# Patient Record
Sex: Female | Born: 1995 | Marital: Single | Smoking: Never smoker
Health system: Southern US, Community
[De-identification: ages and names within clinical notes are randomized; demographics above are authoritative.]

## PROBLEM LIST (undated history)

## (undated) ENCOUNTER — Inpatient Hospital Stay (HOSPITAL_COMMUNITY): Payer: Self-pay

## (undated) DIAGNOSIS — J45909 Unspecified asthma, uncomplicated: Secondary | ICD-10-CM

## (undated) HISTORY — PX: OTHER SURGICAL HISTORY: SHX169

## (undated) HISTORY — DX: Unspecified asthma, uncomplicated: J45.909

---

## 2014-09-22 ENCOUNTER — Ambulatory Visit (INDEPENDENT_AMBULATORY_CARE_PROVIDER_SITE_OTHER): Payer: BC Managed Care – PPO | Admitting: Obstetrics and Gynecology

## 2014-09-22 ENCOUNTER — Encounter: Payer: Self-pay | Admitting: Obstetrics and Gynecology

## 2014-09-22 VITALS — BP 104/60 | HR 64 | Resp 14 | Ht 67.0 in | Wt 118.0 lb

## 2014-09-22 DIAGNOSIS — Z113 Encounter for screening for infections with a predominantly sexual mode of transmission: Secondary | ICD-10-CM | POA: Diagnosis not present

## 2014-09-22 DIAGNOSIS — Z01419 Encounter for gynecological examination (general) (routine) without abnormal findings: Secondary | ICD-10-CM

## 2014-09-22 DIAGNOSIS — A499 Bacterial infection, unspecified: Secondary | ICD-10-CM

## 2014-09-22 DIAGNOSIS — Z0000 Encounter for general adult medical examination without abnormal findings: Secondary | ICD-10-CM

## 2014-09-22 DIAGNOSIS — N760 Acute vaginitis: Secondary | ICD-10-CM

## 2014-09-22 DIAGNOSIS — Z3009 Encounter for other general counseling and advice on contraception: Secondary | ICD-10-CM

## 2014-09-22 DIAGNOSIS — B9689 Other specified bacterial agents as the cause of diseases classified elsewhere: Secondary | ICD-10-CM

## 2014-09-22 DIAGNOSIS — Z8619 Personal history of other infectious and parasitic diseases: Secondary | ICD-10-CM | POA: Diagnosis not present

## 2014-09-22 LAB — POCT URINALYSIS DIPSTICK
BILIRUBIN UA: NEGATIVE
GLUCOSE UA: NEGATIVE
KETONES UA: NEGATIVE
LEUKOCYTES UA: NEGATIVE
Nitrite, UA: NEGATIVE
PH UA: 6.5
Protein, UA: NEGATIVE
RBC UA: NEGATIVE
Urobilinogen, UA: NEGATIVE

## 2014-09-22 LAB — POCT URINE PREGNANCY: Preg Test, Ur: NEGATIVE

## 2014-09-22 MED ORDER — MEDROXYPROGESTERONE ACETATE 150 MG/ML IM SUSP: 150.0000 mg | INTRAMUSCULAR | Status: DC

## 2014-09-22 MED ORDER — METRONIDAZOLE 500 MG PO TABS: 500.0000 mg | ORAL_TABLET | Freq: Two times a day (BID) | ORAL | Status: DC

## 2014-09-22 MED ORDER — MEDROXYPROGESTERONE ACETATE 150 MG/ML IM SUSP
150.0000 mg | Freq: Once | INTRAMUSCULAR | Status: AC
  Administered 2014-09-22: 150 mg via INTRAMUSCULAR

## 2014-09-22 NOTE — Patient Instructions (Signed)
Bacterial Vaginosis Bacterial vaginosis is a vaginal infection that occurs when the normal balance of bacteria in the vagina is disrupted. It results from an overgrowth of certain bacteria. This is the most common vaginal infection in women of childbearing age. Treatment is important to prevent complications, especially in pregnant women, as it can cause a premature delivery. CAUSES  Bacterial vaginosis is caused by an increase in harmful bacteria that are normally present in smaller amounts in the vagina. Several different kinds of bacteria can cause bacterial vaginosis. However, the reason that the condition develops is not fully understood. RISK FACTORS Certain activities or behaviors can put you at an increased risk of developing bacterial vaginosis, including:  Having a new sex partner or multiple sex partners.  Douching.  Using an intrauterine device (IUD) for contraception. Women do not get bacterial vaginosis from toilet seats, bedding, swimming pools, or contact with objects around them. SIGNS AND SYMPTOMS  Some women with bacterial vaginosis have no signs or symptoms. Common symptoms include:  Grey vaginal discharge.  A fishlike odor with discharge, especially after sexual intercourse.  Itching or burning of the vagina and vulva.  Burning or pain with urination. DIAGNOSIS  Your health care provider will take a medical history and examine the vagina for signs of bacterial vaginosis. A sample of vaginal fluid may be taken. Your health care provider will look at this sample under a microscope to check for bacteria and abnormal cells. A vaginal pH test may also be done.  TREATMENT  Bacterial vaginosis may be treated with antibiotic medicines. These may be given in the form of a pill or a vaginal cream. A second round of antibiotics may be prescribed if the condition comes back after treatment.  HOME CARE INSTRUCTIONS   Only take over-the-counter or prescription medicines as  directed by your health care provider.  If antibiotic medicine was prescribed, take it as directed. Make sure you finish it even if you start to feel better.  Do not have sex until treatment is completed.  Tell all sexual partners that you have a vaginal infection. They should see their health care provider and be treated if they have problems, such as a mild rash or itching.  Practice safe sex by using condoms and only having one sex partner. SEEK MEDICAL CARE IF:   Your symptoms are not improving after 3 days of treatment.  You have increased discharge or pain.  You have a fever. MAKE SURE YOU:   Understand these instructions.  Will watch your condition.  Will get help right away if you are not doing well or get worse. FOR MORE INFORMATION  Centers for Disease Control and Prevention, Division of STD Prevention: SolutionApps.co.za American Sexual Health Association (ASHA): www.ashastd.org  Document Released: 01/07/2005 Document Revised: 10/28/2012 Document Reviewed: 08/19/2012 [facility] Patient Information 2015 [place], [place]. This information is not intended to replace advice given to you by your health care provider. Make sure you discuss any questions you have with your health care provider.  EXERCISE AND DIET:  We recommended that you start or continue a regular exercise program for good health. Regular exercise means any activity that makes your heart beat faster and makes you sweat.  We recommend exercising at least 30 minutes per day at least 3 days a week, preferably 4 or 5.  We also recommend a diet low in fat and sugar.  Inactivity, poor dietary choices and obesity can cause diabetes, heart attack, stroke, and kidney damage, among others.  ALCOHOL AND SMOKING:  Women should limit their alcohol intake to no more than 7 drinks/beers/glasses of wine (combined, not each!) per week. Moderation of alcohol intake to this level decreases your risk of breast cancer and liver damage.  And of course, no recreational drugs are part of a healthy lifestyle.  And absolutely no smoking or even second hand smoke. Most people know smoking can cause heart and lung diseases, but did you know it also contributes to weakening of your bones? Aging of your skin?  Yellowing of your teeth and nails?  CALCIUM AND VITAMIN D:  Adequate intake of calcium and Vitamin D are recommended.  The recommendations for exact amounts of these supplements seem to change often, but generally speaking 600 mg of calcium (either carbonate or citrate) and 800 units of Vitamin D per day seems prudent. Certain women may benefit from higher intake of Vitamin D.  If you are among these women, your doctor will have told you during your visit.    PAP SMEARS:  Pap smears, to check for cervical cancer or precancers,  have traditionally been done yearly, although recent scientific advances have shown that most women can have pap smears less often.  However, every woman still should have a physical exam from her gynecologist every year. It will include a breast check, inspection of the vulva and vagina to check for abnormal growths or skin changes, a visual exam of the cervix, and then an exam to evaluate the size and shape of the uterus and ovaries.  And after 20 years of age, a rectal exam is indicated to check for rectal cancers. We will also provide age appropriate advice regarding health maintenance, like when you should have certain vaccines, screening for sexually transmitted diseases, bone density testing, colonoscopy, mammograms, etc.   MAMMOGRAMS:  All women over 9 years old should have a yearly mammogram. Many facilities now offer a "3D" mammogram, which may cost around $50 extra out of pocket. If possible,  we recommend you accept the option to have the 3D mammogram performed.  It both reduces the number of women who will be called back for extra views which then turn out to be normal, and it is better than the routine  mammogram at detecting truly abnormal areas.    COLONOSCOPY:  Colonoscopy to screen for colon cancer is recommended for all women at age 10.  We know, you hate the idea of the prep.  We agree, BUT, having colon cancer and not knowing it is worse!!  Colon cancer so often starts as a polyp that can be seen and removed at colonscopy, which can quite literally save your life!  And if your first colonoscopy is normal and you have no family history of colon cancer, most women don't have to have it again for 10 years.  Once every ten years, you can do something that may end up saving your life, right?  We will be happy to help you get it scheduled when you are ready.  Be sure to check your insurance coverage so you understand how much it will cost.  It may be covered as a preventative service at no cost, but you should check your particular policy.
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
## 2014-09-22 NOTE — Addendum Note (Signed)
Addended by: Dion Body on: 09/22/2014 02:51 PM   Modules accepted: Orders

## 2014-09-23 LAB — STD PANEL
HIV: NONREACTIVE
Hepatitis B Surface Ag: NEGATIVE

## 2014-09-23 LAB — GC/CHLAMYDIA PROBE AMP
CT Probe RNA: NEGATIVE
GC Probe RNA: NEGATIVE

## 2014-09-23 LAB — HEPATITIS C ANTIBODY: HCV AB: NEGATIVE

## 2014-09-27 ENCOUNTER — Telehealth: Payer: Self-pay | Admitting: *Deleted

## 2014-09-27 NOTE — Telephone Encounter (Signed)
-----   Message from Romualdo Bolk, MD sent at 09/23/2014  1:28 PM EDT ----- Please advise the patient of normal results.

## 2014-09-27 NOTE — Telephone Encounter (Signed)
LMTC to call in regards to lab results -eh 

## 2014-10-03 NOTE — Telephone Encounter (Signed)
Patient is aware of her test results -eh

## 2015-02-15 ENCOUNTER — Ambulatory Visit (INDEPENDENT_AMBULATORY_CARE_PROVIDER_SITE_OTHER): Payer: BC Managed Care – PPO

## 2015-02-15 VITALS — BP 98/62 | HR 74 | Resp 16 | Ht 67.0 in | Wt 120.0 lb

## 2015-02-15 DIAGNOSIS — Z3009 Encounter for other general counseling and advice on contraception: Secondary | ICD-10-CM

## 2015-02-15 LAB — POCT URINE PREGNANCY: PREG TEST UR: NEGATIVE

## 2015-02-15 NOTE — Progress Notes (Signed)
Patient is here for pregnancy test to restart depo. Patient advised to abstain from sex for 2 weeks and follow up with another pregnancy test before getting the depo provera injection.  Patient understands instructions and will schedule appt.  She has no other complaints or concerns.  Last aex and depo provera injection:09/22/2014 Next aex:Not scheduled  No other complaints or concerns.  Encounter routed to provider.

## 2015-03-01 ENCOUNTER — Ambulatory Visit: Payer: BC Managed Care – PPO

## 2015-03-01 ENCOUNTER — Telehealth: Payer: Self-pay | Admitting: Obstetrics and Gynecology

## 2015-03-01 NOTE — Telephone Encounter (Signed)
Patient did not keep her appointment for Depo today. I called the patient but could not leave a message. Not Available - Phone message said, "This number is not set up to take incoming calls."

## 2015-03-08 NOTE — Telephone Encounter (Signed)
Called patient in attempt to reschedule her missed appointment but got the message: "This number is not set up to take incoming calls." Okay to close encounter or is further follow up needed?

## 2015-03-08 NOTE — Telephone Encounter (Signed)
It's the patient's choice to get depo-provera. Will close the encounter.

## 2015-05-01 ENCOUNTER — Emergency Department (HOSPITAL_COMMUNITY)
Admission: EM | Admit: 2015-05-01 | Discharge: 2015-05-01 | Disposition: A | Discharge status: Home / Self Care | Payer: No Typology Code available for payment source | Attending: Emergency Medicine | Admitting: Emergency Medicine

## 2015-05-01 ENCOUNTER — Encounter (HOSPITAL_COMMUNITY): Payer: Self-pay | Admitting: Emergency Medicine

## 2015-05-01 DIAGNOSIS — Y998 Other external cause status: Secondary | ICD-10-CM | POA: Insufficient documentation

## 2015-05-01 DIAGNOSIS — Y9389 Activity, other specified: Secondary | ICD-10-CM | POA: Diagnosis not present

## 2015-05-01 DIAGNOSIS — J45909 Unspecified asthma, uncomplicated: Secondary | ICD-10-CM | POA: Insufficient documentation

## 2015-05-01 DIAGNOSIS — Y92410 Unspecified street and highway as the place of occurrence of the external cause: Secondary | ICD-10-CM | POA: Diagnosis not present

## 2015-05-01 DIAGNOSIS — S199XXA Unspecified injury of neck, initial encounter: Secondary | ICD-10-CM | POA: Insufficient documentation

## 2015-05-01 NOTE — ED Notes (Signed)
Called  No response from lobby 

## 2015-05-01 NOTE — ED Notes (Signed)
No answer when pt's name called in the waiting room 
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
## 2015-05-04 ENCOUNTER — Emergency Department (HOSPITAL_COMMUNITY)
Admission: EM | Admit: 2015-05-04 | Discharge: 2015-05-04 | Disposition: A | Discharge status: Home / Self Care | Payer: No Typology Code available for payment source | Attending: Emergency Medicine | Admitting: Emergency Medicine

## 2015-05-04 ENCOUNTER — Encounter (HOSPITAL_COMMUNITY): Payer: Self-pay | Admitting: Emergency Medicine

## 2015-05-04 DIAGNOSIS — S4991XA Unspecified injury of right shoulder and upper arm, initial encounter: Secondary | ICD-10-CM | POA: Insufficient documentation

## 2015-05-04 DIAGNOSIS — Z79899 Other long term (current) drug therapy: Secondary | ICD-10-CM | POA: Insufficient documentation

## 2015-05-04 DIAGNOSIS — S29002A Unspecified injury of muscle and tendon of back wall of thorax, initial encounter: Secondary | ICD-10-CM | POA: Insufficient documentation

## 2015-05-04 DIAGNOSIS — J45909 Unspecified asthma, uncomplicated: Secondary | ICD-10-CM | POA: Insufficient documentation

## 2015-05-04 DIAGNOSIS — M549 Dorsalgia, unspecified: Secondary | ICD-10-CM

## 2015-05-04 DIAGNOSIS — Y998 Other external cause status: Secondary | ICD-10-CM | POA: Insufficient documentation

## 2015-05-04 DIAGNOSIS — Y92410 Unspecified street and highway as the place of occurrence of the external cause: Secondary | ICD-10-CM | POA: Diagnosis not present

## 2015-05-04 DIAGNOSIS — S4992XA Unspecified injury of left shoulder and upper arm, initial encounter: Secondary | ICD-10-CM | POA: Insufficient documentation

## 2015-05-04 DIAGNOSIS — Y9389 Activity, other specified: Secondary | ICD-10-CM | POA: Insufficient documentation

## 2015-05-04 MED ORDER — NAPROXEN 500 MG PO TABS
500.0000 mg | ORAL_TABLET | Freq: Once | ORAL | Status: AC
  Administered 2015-05-04: 500 mg via ORAL
  Filled 2015-05-04: qty 1

## 2015-05-04 MED ORDER — NAPROXEN 500 MG PO TABS: 500.0000 mg | ORAL_TABLET | Freq: Two times a day (BID) | ORAL | Status: DC

## 2015-05-04 MED ORDER — METHOCARBAMOL 500 MG PO TABS
500.0000 mg | ORAL_TABLET | Freq: Once | ORAL | Status: AC
  Administered 2015-05-04: 500 mg via ORAL
  Filled 2015-05-04: qty 1

## 2015-05-04 MED ORDER — METHOCARBAMOL 500 MG PO TABS: 500.0000 mg | ORAL_TABLET | Freq: Two times a day (BID) | ORAL | Status: DC
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
## 2015-06-22 ENCOUNTER — Encounter: Payer: Self-pay | Admitting: Obstetrics and Gynecology

## 2015-06-22 ENCOUNTER — Telehealth: Payer: Self-pay | Admitting: Obstetrics and Gynecology

## 2015-06-22 NOTE — Telephone Encounter (Signed)
Patient would like to speak with the nurse regarding restarting the depo.

## 2015-06-23 NOTE — Telephone Encounter (Signed)
Spoke with patient. Patient would like to restart depo injections at this time. Patient was on depo five years ago. Discussed restarting depo with Dr.Jertson at her appointment on 09/22/2014. Advised she will need to come in for UPT if this is negative will need to abstain from intercourse for 2 weeks then return for another UPT and depo injection if both UPTs are negative. She is agreeable. Nurse visit for UPT scheduled for 06/27/2015 at 8:30 am. Patient declines to schedule 2 week upt and depo at this time. She will schedule this at her appointment on 06/27/2015.  Routing to provider for final review. Patient agreeable to disposition. Will close encounter.

## 2015-06-27 ENCOUNTER — Ambulatory Visit (INDEPENDENT_AMBULATORY_CARE_PROVIDER_SITE_OTHER): Payer: BC Managed Care – PPO

## 2015-06-27 VITALS — BP 102/60 | HR 72 | Resp 16 | Wt 114.0 lb

## 2015-06-27 DIAGNOSIS — Z3049 Encounter for surveillance of other contraceptives: Secondary | ICD-10-CM

## 2015-06-27 LAB — POCT URINE PREGNANCY: Preg Test, Ur: NEGATIVE

## 2015-06-27 NOTE — Progress Notes (Signed)
Patient here for UPT before receiving depo provera injection. Urine pregnancy test was negative; patient was instructed to abstain and return for another UPT in two weeks before getting depo shot.

## 2015-07-13 ENCOUNTER — Ambulatory Visit: Payer: BC Managed Care – PPO

## 2015-07-20 ENCOUNTER — Telehealth: Payer: Self-pay | Admitting: Obstetrics and Gynecology

## 2015-07-20 ENCOUNTER — Encounter: Payer: Self-pay | Admitting: Obstetrics and Gynecology

## 2015-07-20 ENCOUNTER — Ambulatory Visit: Payer: BC Managed Care – PPO

## 2015-07-20 NOTE — Telephone Encounter (Signed)
Patient dnka her depo appointment 07/20/15. I was unable to leave a message, letter mailed.

## 2015-07-21 NOTE — Telephone Encounter (Signed)
This is Dr. Edward JollySilva reviewing Dr. Salli QuarryJertson's in box.  Thank you for the update.  I have closed the encounter.

## 2015-10-20 ENCOUNTER — Ambulatory Visit: Payer: BC Managed Care – PPO | Admitting: Certified Nurse Midwife

## 2015-10-20 ENCOUNTER — Telehealth: Payer: Self-pay | Admitting: Certified Nurse Midwife

## 2015-10-20 NOTE — Telephone Encounter (Signed)
Patient cancelled appointment today. See staff message °

## 2015-10-26 ENCOUNTER — Encounter: Payer: Self-pay | Admitting: Obstetrics and Gynecology

## 2015-11-12 ENCOUNTER — Emergency Department (HOSPITAL_COMMUNITY): Payer: Medicaid Other

## 2015-11-12 ENCOUNTER — Emergency Department (HOSPITAL_COMMUNITY)
Admission: EM | Admit: 2015-11-12 | Discharge: 2015-11-12 | Disposition: A | Discharge status: Home / Self Care | Payer: Medicaid Other | Attending: Emergency Medicine | Admitting: Emergency Medicine

## 2015-11-12 ENCOUNTER — Encounter (HOSPITAL_COMMUNITY): Payer: Self-pay | Admitting: Nurse Practitioner

## 2015-11-12 DIAGNOSIS — O200 Threatened abortion: Secondary | ICD-10-CM | POA: Diagnosis not present

## 2015-11-12 DIAGNOSIS — J45909 Unspecified asthma, uncomplicated: Secondary | ICD-10-CM | POA: Insufficient documentation

## 2015-11-12 DIAGNOSIS — O418X20 Other specified disorders of amniotic fluid and membranes, second trimester, not applicable or unspecified: Secondary | ICD-10-CM

## 2015-11-12 DIAGNOSIS — O4690 Antepartum hemorrhage, unspecified, unspecified trimester: Secondary | ICD-10-CM

## 2015-11-12 DIAGNOSIS — Z3A12 12 weeks gestation of pregnancy: Secondary | ICD-10-CM | POA: Insufficient documentation

## 2015-11-12 DIAGNOSIS — O468X2 Other antepartum hemorrhage, second trimester: Secondary | ICD-10-CM

## 2015-11-12 DIAGNOSIS — Z79899 Other long term (current) drug therapy: Secondary | ICD-10-CM | POA: Insufficient documentation

## 2015-11-12 DIAGNOSIS — O208 Other hemorrhage in early pregnancy: Secondary | ICD-10-CM | POA: Diagnosis present

## 2015-11-12 LAB — CBC WITH DIFFERENTIAL/PLATELET
Basophils Absolute: 0 10*3/uL (ref 0.0–0.1)
Basophils Relative: 0 %
EOS ABS: 0.2 10*3/uL (ref 0.0–0.7)
EOS PCT: 2 %
HCT: 31.3 % — ABNORMAL LOW (ref 36.0–46.0)
HEMOGLOBIN: 11.2 g/dL — AB (ref 12.0–15.0)
LYMPHS ABS: 2.9 10*3/uL (ref 0.7–4.0)
Lymphocytes Relative: 31 %
MCH: 31.4 pg (ref 26.0–34.0)
MCHC: 35.8 g/dL (ref 30.0–36.0)
MCV: 87.7 fL (ref 78.0–100.0)
MONO ABS: 1.3 10*3/uL — AB (ref 0.1–1.0)
MONOS PCT: 14 %
Neutro Abs: 5.1 10*3/uL (ref 1.7–7.7)
Neutrophils Relative %: 53 %
PLATELETS: 256 10*3/uL (ref 150–400)
RBC: 3.57 MIL/uL — ABNORMAL LOW (ref 3.87–5.11)
RDW: 12.5 % (ref 11.5–15.5)
WBC: 9.5 10*3/uL (ref 4.0–10.5)

## 2015-11-12 LAB — HIV ANTIBODY (ROUTINE TESTING W REFLEX): HIV Screen 4th Generation wRfx: NONREACTIVE

## 2015-11-12 LAB — BASIC METABOLIC PANEL
Anion gap: 5 (ref 5–15)
BUN: 11 mg/dL (ref 6–20)
CHLORIDE: 106 mmol/L (ref 101–111)
CO2: 24 mmol/L (ref 22–32)
CREATININE: 0.41 mg/dL — AB (ref 0.44–1.00)
Calcium: 8.9 mg/dL (ref 8.9–10.3)
GFR calc Af Amer: >60 mL/min (ref >60)
GFR calc non Af Amer: >60 mL/min (ref >60)
Glucose, Bld: 84 mg/dL (ref 65–99)
Potassium: 3.9 mmol/L (ref 3.5–5.1)
SODIUM: 135 mmol/L (ref 135–145)

## 2015-11-12 LAB — RPR: RPR: NONREACTIVE

## 2015-11-12 LAB — I-STAT BETA HCG BLOOD, ED (MC, WL, AP ONLY)

## 2015-11-12 LAB — HCG, QUANTITATIVE, PREGNANCY: hCG, Beta Chain, Quant, S: 188794 m[IU]/mL — ABNORMAL HIGH (ref <5)

## 2015-11-12 LAB — ABO/RH: ABO/RH(D): O POS

## 2015-11-12 NOTE — ED Triage Notes (Signed)
Pt states she has been having intermittent vaginal bleeding since 3 pm, also c/o lower abd pain.

## 2015-11-12 NOTE — ED Provider Notes (Signed)
[facility]-EMERGENCY DEPT Provider Note   CSN: [id number] Arrival date & time: 11/12/15  0222  By signing my name below, I, Kristina Reyes, attest that this documentation has been prepared under the direction and in the presence of Elpidio Anis, PA-C. Electronically Signed: Alyssa Reyes, ED Scribe. 11/12/15. 3:21 AM.  History   Chief Complaint Chief Complaint  Patient presents with  . Vaginal Bleeding  . [redacted] Weeks Pregnant   The history is provided by the patient. No language interpreter was used.   HPI Comments: Kristina Reyes is a 20 y.o. female who presents to the Emergency Department complaining of gradual onset, episodic vaginal bleeding onset 3 PM today. Pt is currently [redacted] weeks pregnant. This is the pt's first pregnancy. Compared to her period, she states her bleeding is very light. Pt is not passing clots. She reports associated mild lower abdominal pain. Pt is recovering from a recent cold and states she still has a fever. She has been taking Tylenol for symptom relief. Denies back pain, dysuria, hematuria, urinary frequency.  Past Medical History:  Diagnosis Date  . Asthma     There are no active problems to display for this patient.   Past Surgical History:  Procedure Laterality Date  . arm surgery Left    BB removed from left forearm.     OB History    Gravida Para Term Preterm AB Living   0 0 0 0 0 0   SAB TAB Ectopic Multiple Live Births   0 0 0 0         Home Medications    Prior to Admission medications   Medication Sig Start Date End Date Taking? Authorizing Provider  medroxyPROGESTERone (DEPO-PROVERA) 150 MG/ML injection Inject 150 mg into the muscle every 3 (three) months.    Historical Provider, MD  medroxyPROGESTERone (DEPO-PROVERA) 150 MG/ML injection Inject 1 mL (150 mg total) into the muscle every 3 (three) months. 09/22/14   Romualdo Bolk, MD  methocarbamol (ROBAXIN) 500 MG tablet Take 1 tablet (500 mg total) by mouth 2 (two) times daily. 05/04/15    Mady Gemma, PA-C  metroNIDAZOLE (FLAGYL) 500 MG tablet Take 1 tablet (500 mg total) by mouth 2 (two) times daily. 09/22/14   Romualdo Bolk, MD  naproxen (NAPROSYN) 500 MG tablet Take 1 tablet (500 mg total) by mouth 2 (two) times daily with a meal. 05/04/15   Mady Gemma, PA-C    Family History Family History  Problem Relation Age of Onset  . Asthma Mother   . Asthma Brother     Social History Social History  Substance Use Topics  . Smoking status: Never Smoker  . Smokeless tobacco: Never Used  . Alcohol use No     Allergies   Review of patient's allergies indicates no known allergies.   Review of Systems Review of Systems  Constitutional: Positive for fever.  Gastrointestinal: Positive for abdominal pain.  Genitourinary: Positive for vaginal bleeding. Negative for difficulty urinating, dysuria and frequency.  Musculoskeletal: Negative for back pain.  All other systems reviewed and are negative.    Physical Exam Updated Vital Signs BP 104/66 (BP Location: Left Arm)   Pulse 82   Temp 98.4 F (36.9 C) (Oral)   Resp 16   SpO2 99%   Physical Exam  Constitutional: She is oriented to person, place, and time. She appears well-developed and well-nourished.  Neck: Normal range of motion.  Pulmonary/Chest: Effort normal.  Abdominal: Soft. There is no tenderness.  Genitourinary:  Genitourinary Comments: Cervical bleeding present without clots or visualized POC. Mild pelvic tenderness throughout.   Neurological: She is alert and oriented to person, place, and time.  Skin: Skin is warm and dry.     ED Treatments / Results  DIAGNOSTIC STUDIES: Oxygen Saturation is 99% on RA, normal by my interpretation.    COORDINATION OF CARE: 3:15 AM Discussed treatment plan with pt at bedside which includes Pelvic exam and lab work and pt agreed to plan.  Labs (all labs ordered are listed, but only abnormal results are displayed) Labs Reviewed - No data to  display  EKG  EKG Interpretation None       Radiology No results found. [name] Ob Comp Less 14 Wks  Result Date: 11/12/2015 CLINICAL DATA:  20 year old female with positive HCG levels presenting with vaginal bleeding. EXAM: OBSTETRIC <14 WK ULTRASOUND TECHNIQUE: Transabdominal ultrasound was performed for evaluation of the gestation as well as the maternal uterus and adnexal regions. COMPARISON:  None. FINDINGS: Intrauterine gestational sac: Single intrauterine gestational sac. It Yolk sac:  Seen Embryo:  Present Cardiac Activity: Detected Heart Rate: 162 bpm CRL:   53  mm   12 w 0 d                  US EDC: 05/26/2016 Subchorionic hemorrhage: Subchorionic hemorrhage measures 3.3 x 1.5 cm. Maternal uterus/adnexae: The right ovary is not visualized. The left ovary appears unremarkable. No significant free fluid within the pelvis. IMPRESSION: Single live intrauterine pregnancy with an estimated gestational age of [redacted] weeks, 0 days. Electronically Signed   By: Elgie CollardArash  Radparvar M.D.   On: 11/12/2015 05:14    Procedures Procedures (including critical care time)  Medications Ordered in ED Medications - No data to display   Initial Impression / Assessment and Plan / ED Course  I have reviewed the triage vital signs and the nursing notes.  Pertinent labs & imaging results that were available during my care of the patient were reviewed by me and considered in my medical decision making (see chart for details).  Clinical Course   1. Threatened abortion  Patient with vaginal bleeding at 12 weeks. Viable IUP confirmed on US. Discussed importance of follow up with GYN for further management. [facility]. Patient is comfortable with plan for discharge home.     I personally performed the services described in this documentation, which was scribed in my presence. The recorded information has been reviewed and is accurate.     Final Clinical Impressions(s) / ED  Diagnoses   Final diagnoses:  None    New Prescriptions New Prescriptions   No medications on file     Elpidio AnisShari Sabre Romberger, PA-C 11/20/15 0446    Layla MawKristen N Ward, DO 11/21/15 2258

## 2015-11-12 NOTE — Discharge Instructions (Signed)
[facility] as [facility]. If you have any increased bleeding or pain, [facility] for further [facility] care. Return here at any time.

## 2015-11-13 ENCOUNTER — Telehealth: Payer: Self-pay | Admitting: General Practice

## 2015-11-13 NOTE — Telephone Encounter (Signed)
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 Called patient, no answer- left message stating we are trying to reach you to return your phone call, please call us back.

## 2015-11-16 ENCOUNTER — Encounter: Payer: Self-pay | Admitting: Obstetrics and Gynecology

## 2015-11-16 ENCOUNTER — Ambulatory Visit (INDEPENDENT_AMBULATORY_CARE_PROVIDER_SITE_OTHER): Payer: Medicaid Other | Admitting: Obstetrics and Gynecology

## 2015-11-16 VITALS — BP 118/73 | HR 77 | Wt 119.8 lb

## 2015-11-16 DIAGNOSIS — O468X1 Other antepartum hemorrhage, first trimester: Secondary | ICD-10-CM

## 2015-11-16 DIAGNOSIS — Z3400 Encounter for supervision of normal first pregnancy, unspecified trimester: Secondary | ICD-10-CM | POA: Insufficient documentation

## 2015-11-16 DIAGNOSIS — O200 Threatened abortion: Secondary | ICD-10-CM | POA: Insufficient documentation

## 2015-11-16 DIAGNOSIS — O418X10 Other specified disorders of amniotic fluid and membranes, first trimester, not applicable or unspecified: Secondary | ICD-10-CM | POA: Insufficient documentation

## 2015-11-16 DIAGNOSIS — Z3401 Encounter for supervision of normal first pregnancy, first trimester: Secondary | ICD-10-CM

## 2015-11-16 DIAGNOSIS — Z113 Encounter for screening for infections with a predominantly sexual mode of transmission: Secondary | ICD-10-CM

## 2015-11-16 DIAGNOSIS — Z3481 Encounter for supervision of other normal pregnancy, first trimester: Secondary | ICD-10-CM

## 2015-11-16 DIAGNOSIS — Z3482 Encounter for supervision of other normal pregnancy, second trimester: Secondary | ICD-10-CM

## 2015-11-16 LAB — POCT URINALYSIS DIP (DEVICE)
Bilirubin Urine: NEGATIVE
Glucose, UA: NEGATIVE mg/dL
HGB URINE DIPSTICK: NEGATIVE
Ketones, ur: NEGATIVE mg/dL
LEUKOCYTES UA: NEGATIVE
Nitrite: NEGATIVE
PH: 6.5 (ref 5.0–8.0)
Protein, ur: NEGATIVE mg/dL
SPECIFIC GRAVITY, URINE: 1.025 (ref 1.005–1.030)
UROBILINOGEN UA: 0.2 mg/dL (ref 0.0–1.0)

## 2015-11-16 LAB — OB RESULTS CONSOLE GBS: STREP GROUP B AG: POSITIVE

## 2015-11-16 LAB — OB RESULTS CONSOLE GC/CHLAMYDIA: Gonorrhea: NEGATIVE

## 2015-11-16 NOTE — Progress Notes (Signed)
New OB Note  11/16/2015   [facility]  Chief Complaint: NOB  Transfer of Care Patient: no  History of Present Illness: Ms. Kristina Reyes is a 20 y.o. G1 @ 12/5 weeks (EDC 5/5, based on Patient's last menstrual period was 08/19/2015.=12wk u/s), with the above CC. Preg complicated by has Supervision of normal pregnancy in first trimester on her problem list., [facility] and [facility] AB  Her periods were: regular, qmonth She was using no method when she conceived.  She has Negative signs or symptoms of nausea/vomiting of pregnancy. She has + signs or symptoms of miscarriage or preterm labor. Pt seen in the ER for VB and had 3cm SCH with 12wk SLIUP, Rh pos. Negative hiv, rpr, bmp, cbc (borderline anemia @ Hct 31), O pos Pt states she has had the varicella vaccine.  She identifies Negative Zika risk factors for her and her partner On any different medications around the time she conceived/early pregnancy: she was using THC and rare ETOH use before she found out she was pregnant in late september  ROS: A 12-point review of systems was performed and negative, except as stated in the above HPI.  OBGYN History: As per HPI. OB History  Gravida Para Term Preterm AB Living  1 0 0 0 0 0  SAB TAB Ectopic Multiple Live Births  0 0 0 0      # Outcome Date GA Lbr Len/2nd Weight Sex Delivery Anes PTL Lv  1 Current              Any issues with any prior pregnancies: not applicable Any prior children are healthy, doing well, without any problems or issues: not applicable History of pap smears: No.  History of STIs: in the remote past   Past Medical History: Past Medical History:  Diagnosis Date  . Asthma     Past Surgical History: Past Surgical History:  Procedure Laterality Date  . arm surgery Left    BB removed from left forearm.     Family History:  Family History  Problem Relation Age of Onset  . Asthma Mother   . Asthma Brother    She denies any history of  mental retardation, birth defects or genetic disorders in her or the FOB's history (he confirms)  Social History:  Social History   Social History  . Marital status: Single    Spouse name: N/A  . Number of children: N/A  . Years of education: N/A   Occupational History  . Not on file.   Social History Main Topics  . Smoking status: Never Smoker  . Smokeless tobacco: Never Used  . Alcohol use No  . Drug use: No  . Sexual activity: Not Currently    Partners: Male    Birth control/ protection: Injection, None   Other Topics Concern  . Not on file   Social History Narrative  . No narrative on file    Allergy: No Known Allergies  Health Maintenance:  Mammogram Up to Date: not applicable  Current Outpatient Medications: PNV  Physical Exam:   BP 118/73   Pulse 77   Wt 119 lb 12.8 oz (54.3 kg)   LMP 08/19/2015   BMI 18.22 kg/m  Body mass index is 18.22 kg/m. Fundal height: not applicable FHTs: 160s  General appearance: Well nourished, well developed female in no acute distress.  Neck:  Supple, normal appearance, and no thyromegaly  Cardiovascular: S1, S2 normal, no murmur, rub or gallop, regular rate and  rhythm Respiratory:  Clear to auscultation bilateral. Normal respiratory effort Abdomen: positive bowel sounds and no masses, hernias; diffusely non tender to palpation, non distended Breasts: breasts appear normal, no suspicious masses, no skin or nipple changes or axillary nodes, patient declines to have breast exam. Neuro/Psych:  Normal mood and affect.  Skin:  Warm and dry.   Pelvic exam: patient declines  Laboratory: As above  Imaging:  As above  Assessment: Pt doing well with early pregnancy  Plan: 1. Encounter for supervision of other normal pregnancy in second trimester Routine care. Pt elects for genetic screening - Antibody Screen - Hepatitis B Surface AntiGEN - Rubella Antibody, IgM - GC/Chlamydia probe amp ()not at [facility]  MFM Fetal Nuchal Translucency; Future -CF and hemoglobinopathy testing  2. VB in early pregnancy Pt with just spotting (old blood, no pain) currently. Pt given ER precautions and told to do pelvic rest until spotting resolves and to come in for a visit if more than just spotting.  Problem list reviewed and updated.  Follow up in 3weeks.  >50% of 20 min visit spent on counseling and coordination of care.     Cornelia Copa MD Attending [facility])

## 2015-11-16 NOTE — Progress Notes (Signed)
Initial prenatal education packet given Declines flu vaccine Breastfeeding tip reviewed Pt declines pap/pelvic exam at this time

## 2015-11-17 ENCOUNTER — Encounter: Payer: Self-pay | Admitting: Family Medicine

## 2015-11-17 LAB — GC/CHLAMYDIA PROBE AMP (~~LOC~~) NOT AT ARMC
Chlamydia: NEGATIVE
Neisseria Gonorrhea: NEGATIVE

## 2015-11-17 LAB — ANTIBODY SCREEN: Antibody Screen: NEGATIVE

## 2015-11-17 LAB — HEPATITIS B SURFACE ANTIGEN: Hepatitis B Surface Ag: NEGATIVE

## 2015-11-18 ENCOUNTER — Encounter: Payer: Self-pay | Admitting: Obstetrics and Gynecology

## 2015-11-18 DIAGNOSIS — R8271 Bacteriuria: Secondary | ICD-10-CM | POA: Insufficient documentation

## 2015-11-18 LAB — CULTURE, OB URINE

## 2015-11-20 ENCOUNTER — Encounter: Payer: Self-pay | Admitting: Obstetrics and Gynecology

## 2015-11-20 DIAGNOSIS — Z87898 Personal history of other specified conditions: Secondary | ICD-10-CM | POA: Insufficient documentation

## 2015-11-20 DIAGNOSIS — F1291 Cannabis use, unspecified, in remission: Secondary | ICD-10-CM | POA: Insufficient documentation

## 2015-11-20 LAB — PAIN MGMT, PROFILE 6 CONF W/O MM, U
6 ACETYLMORPHINE: NEGATIVE ng/mL (ref <10)
AMPHETAMINES: NEGATIVE ng/mL (ref <500)
Alcohol Metabolites: NEGATIVE ng/mL (ref <500)
BARBITURATES: NEGATIVE ng/mL (ref <300)
Benzodiazepines: NEGATIVE ng/mL (ref <100)
COCAINE METABOLITE: NEGATIVE ng/mL (ref <150)
CREATININE: 105 mg/dL (ref >=20.0)
METHADONE METABOLITE: NEGATIVE ng/mL (ref <100)
Marijuana Metabolite: 13 ng/mL — ABNORMAL HIGH (ref <5)
Marijuana Metabolite: POSITIVE ng/mL — AB (ref <20)
OXIDANT: NEGATIVE ug/mL (ref <200)
Opiates: NEGATIVE ng/mL (ref <100)
Oxycodone: NEGATIVE ng/mL (ref <100)
PH: 6.18 (ref 4.5–9.0)
Phencyclidine: NEGATIVE ng/mL (ref <25)
Please note:: 0

## 2015-11-20 LAB — HEMOGLOBINOPATHY EVALUATION
HCT: 32.6 % — ABNORMAL LOW (ref 35.0–45.0)
HGB A2 QUANT: 2.5 % (ref 1.8–3.5)
HGB A: 96.5 % (ref >96.0)
Hemoglobin: 11.1 g/dL — ABNORMAL LOW (ref 11.7–15.5)
MCH: 30.5 pg (ref 27.0–33.0)
MCV: 89.6 fL (ref 80.0–100.0)
RBC: 3.64 MIL/uL — AB (ref 3.80–5.10)
RDW: 13.4 % (ref 11.0–15.0)

## 2015-11-21 LAB — CYSTIC FIBROSIS DIAGNOSTIC STUDY

## 2015-11-21 LAB — RUBELLA ANTIBODY, IGM

## 2015-11-24 ENCOUNTER — Ambulatory Visit (HOSPITAL_COMMUNITY)
Admission: RE | Admit: 2015-11-24 | Discharge: 2015-11-24 | Disposition: A | Discharge status: Home / Self Care | Payer: Medicaid Other | Source: Ambulatory Visit | Attending: Obstetrics and Gynecology | Admitting: Obstetrics and Gynecology

## 2015-11-24 ENCOUNTER — Encounter (HOSPITAL_COMMUNITY): Payer: Self-pay

## 2015-11-24 DIAGNOSIS — Z3A13 13 weeks gestation of pregnancy: Secondary | ICD-10-CM | POA: Insufficient documentation

## 2015-11-24 DIAGNOSIS — Z3682 Encounter for antenatal screening for nuchal translucency: Secondary | ICD-10-CM | POA: Diagnosis not present

## 2015-11-29 ENCOUNTER — Telehealth: Payer: Self-pay

## 2015-11-29 MED ORDER — NITROFURANTOIN MONOHYD MACRO 100 MG PO CAPS: 100.0000 mg | ORAL_CAPSULE | Freq: Two times a day (BID) | ORAL | 0 refills | Status: DC

## 2015-11-29 NOTE — Telephone Encounter (Signed)
Per Dr. Vergie LivingPickens, pt needs treatment for UTI per Macrobid standing protocol.   LM on VM stating that an Rx has been sent to [facility] [address] for a UTI.  If she has any questions to please give us a call.

## 2015-12-04 ENCOUNTER — Other Ambulatory Visit (HOSPITAL_COMMUNITY): Payer: Self-pay

## 2015-12-07 ENCOUNTER — Ambulatory Visit (INDEPENDENT_AMBULATORY_CARE_PROVIDER_SITE_OTHER): Payer: Medicaid Other | Admitting: Obstetrics and Gynecology

## 2015-12-07 ENCOUNTER — Encounter: Payer: Self-pay | Admitting: Obstetrics and Gynecology

## 2015-12-07 VITALS — BP 103/66 | HR 91 | Wt 125.0 lb

## 2015-12-07 DIAGNOSIS — Z3401 Encounter for supervision of normal first pregnancy, first trimester: Secondary | ICD-10-CM

## 2015-12-07 DIAGNOSIS — R8271 Bacteriuria: Secondary | ICD-10-CM

## 2015-12-07 DIAGNOSIS — O468X1 Other antepartum hemorrhage, first trimester: Secondary | ICD-10-CM

## 2015-12-07 DIAGNOSIS — O418X10 Other specified disorders of amniotic fluid and membranes, first trimester, not applicable or unspecified: Secondary | ICD-10-CM

## 2015-12-07 MED ORDER — CEPHALEXIN 500 MG PO CAPS: 500.0000 mg | ORAL_CAPSULE | Freq: Four times a day (QID) | ORAL | 0 refills | Status: AC

## 2015-12-07 NOTE — Progress Notes (Signed)
Prenatal Visit Note Date: 12/07/2015 [facility]  Subjective:  Isabell JarvisJada Greenwell is a 20 y.o. G1P0000 at 6536w5d being seen today for ongoing prenatal care.  She is currently monitored for the following issues for this low-risk pregnancy and has Supervision of normal pregnancy in first trimester; Subchorionic hematoma in first trimester; Threatened abortion in early pregnancy; GBS bacteriuria; and History of marijuana use on her problem list.  Patient reports no complaints.   Contractions: Not present. Vag. Bleeding: None.  Movement: Absent. Denies leaking of fluid.   The following portions of the patient's history were reviewed and updated as appropriate: allergies, current medications, past family history, past medical history, past social history, past surgical history and problem list. Problem list updated.  Objective:   Vitals:   12/07/15 0751  BP: 103/66  Pulse: 91  Weight: 125 lb (56.7 kg)    Fetal Status: Fetal Heart Rate (bpm): 152   Movement: Absent     General:  Alert, oriented and cooperative. Patient is in no acute distress.  Skin: Skin is warm and dry. No rash noted.   Cardiovascular: Normal heart rate noted  Respiratory: Normal respiratory effort, no problems with respiration noted  Abdomen: Soft, gravid, appropriate for gestational age. Pain/Pressure: Absent     Pelvic:  Cervical exam deferred        Extremities: Normal range of motion.  Edema: None  Mental Status: Normal mood and affect. Normal behavior. Normal judgment and thought content.   Urinalysis:      Assessment and Plan:  Pregnancy: G1P0000 at 536w5d  1. Encounter for supervision of normal first pregnancy in first trimester Routine care. Pt amenable to afp. Anatomy scan in 3650m - US MFM OB COMP + 14 WK; Future - AFP, Quad Screen  2. GBS bacteriuria Didn't take the macrobid b/c of cost (no medicaid yet). No systemic s/s. Keflex sent in  Preterm labor symptoms and general obstetric  precautions including but not limited to vaginal bleeding, contractions, leaking of fluid and fetal movement were reviewed in detail with the patient. Please refer to After Visit Summary for other counseling recommendations.  Return in about 4 weeks (around 01/04/2016).   Volta Bingharlie Milt Coye, MD

## 2015-12-07 NOTE — Patient Instructions (Signed)
AREA PEDIATRIC/FAMILY PRACTICE PHYSICIANS  [facility] [address], [address] [place], [place]  [address] Phone - [phone]   Fax - [phone]  [facility] [address] [place], [place] [address] Phone - [phone]   Fax - [phone]  [facility] [address] [place], [place]  [address] Phone - [phone]   Fax - [phone]  [facility] [address] [place], [place]  [address] Phone - [phone]   Fax - [phone]   [facility] [address] [place], [place]  [address] Phone - [phone]   Fax - [phone]  [facility] [address] [place], [place]  [address] Phone - [phone]   Fax - [phone]  [facility] [address] [place], [place]  [address] Phone - [phone]   Fax - [phone]  [facility] [address] [place], [place]  [address] Phone - [phone]   Fax - [phone]  [facility] [address] [place], [place]  [address] Phone - [phone]   Fax - [phone] [facility] [address] [place], [place]  [address] Phone - [phone]   Fax - [phone]  [facility] [address] [place], [place]  [address] Phone - [phone]   Fax - [phone]  [facility] [address] [place], [place]  [address] Phone - [phone]   Fax - [phone]  [facility] [address] [place], [place]  [address] Phone - [phone]   Fax - [phone]  [facility] [address] [place], [place]  [address] Phone - [phone]  [facility] [address] [place], [place]  [address] Phone - [phone]   Fax - [phone]  [facility] [address] [place], [place]  [address] Phone - [phone]   Fax - [phone]  [facility] [address] [place], [place]  [address] Phone - [phone]   Fax - [phone]  [facility] [address] [place], [place]  [address] Phone - [phone]   Fax - [phone]   [facility] [address] [place], [place]  [address] Phone - [phone]   Fax - [phone]  [facility] [address] [place], [place]  [address] Phone - [phone]   Fax - [phone]  [facility] [address] [place], [place]  [address] Phone - [phone]   Fax - [phone]  [facility] [address] [place], [place]  [address] Phone - [phone]   Fax - [phone]  [facility] [address] [place], [place]  [address] Phone - [phone]   Fax - [phone] [facility] [address] [place], [place]  [address] Phone - [phone]   Fax - [phone]  [facility] [address] [place], [place]  [address] Phone - [phone]   Fax - [phone]  [facility] [address] [place],   [address] Phone - [phone]   Fax - [phone]   

## 2015-12-08 LAB — ALPHA FETOPROTEIN, MATERNAL
AFP: 77.3 ng/mL
Curr Gest Age: 15.7 weeks
MOM FOR AFP: 1.91
Open Spina bifida: NEGATIVE

## 2016-01-02 ENCOUNTER — Ambulatory Visit (HOSPITAL_COMMUNITY): Payer: Medicaid Other

## 2016-01-04 ENCOUNTER — Other Ambulatory Visit: Payer: Self-pay | Admitting: Obstetrics and Gynecology

## 2016-01-04 ENCOUNTER — Ambulatory Visit (HOSPITAL_COMMUNITY)
Admission: RE | Admit: 2016-01-04 | Discharge: 2016-01-04 | Disposition: A | Discharge status: Home / Self Care | Payer: Medicaid Other | Source: Ambulatory Visit | Attending: Obstetrics and Gynecology | Admitting: Obstetrics and Gynecology

## 2016-01-04 ENCOUNTER — Ambulatory Visit (INDEPENDENT_AMBULATORY_CARE_PROVIDER_SITE_OTHER): Payer: Medicaid Other | Admitting: Obstetrics & Gynecology

## 2016-01-04 VITALS — BP 110/67 | HR 85 | Wt 131.8 lb

## 2016-01-04 DIAGNOSIS — Z3A19 19 weeks gestation of pregnancy: Secondary | ICD-10-CM | POA: Diagnosis not present

## 2016-01-04 DIAGNOSIS — Z3402 Encounter for supervision of normal first pregnancy, second trimester: Secondary | ICD-10-CM | POA: Diagnosis not present

## 2016-01-04 DIAGNOSIS — Z363 Encounter for antenatal screening for malformations: Secondary | ICD-10-CM | POA: Insufficient documentation

## 2016-01-04 DIAGNOSIS — O358XX0 Maternal care for other (suspected) fetal abnormality and damage, not applicable or unspecified: Secondary | ICD-10-CM | POA: Diagnosis present

## 2016-01-04 DIAGNOSIS — O359XX0 Maternal care for (suspected) fetal abnormality and damage, unspecified, not applicable or unspecified: Secondary | ICD-10-CM

## 2016-01-04 DIAGNOSIS — Z3400 Encounter for supervision of normal first pregnancy, unspecified trimester: Secondary | ICD-10-CM

## 2016-01-04 DIAGNOSIS — Z3401 Encounter for supervision of normal first pregnancy, first trimester: Secondary | ICD-10-CM

## 2016-01-04 NOTE — Progress Notes (Signed)
US was done today   PRENATAL VISIT NOTE  Subjective:  Kristina Reyes is a 20 y.o. G1P0000 at 6434w5d being seen today for ongoing prenatal care.  She is currently monitored for the following issues for this low-risk pregnancy and has Supervision of normal first pregnancy, antepartum; Threatened abortion in early pregnancy; GBS bacteriuria; and History of marijuana use on her problem list.  Patient reports no complaints.  Contractions: Not present. Vag. Bleeding: None.  Movement: Present. Denies leaking of fluid.   The following portions of the patient's history were reviewed and updated as appropriate: allergies, current medications, past family history, past medical history, past social history, past surgical history and problem list. Problem list updated.  Objective:   Vitals:   01/04/16 1010  BP: 110/67  Pulse: 85  Weight: 131 lb 12.8 oz (59.8 kg)    Fetal Status: Fetal Heart Rate (bpm): 154   Movement: Present     General:  Alert, oriented and cooperative. Patient is in no acute distress.  Skin: Skin is warm and dry. No rash noted.   Cardiovascular: Normal heart rate noted  Respiratory: Normal respiratory effort, no problems with respiration noted  Abdomen: Soft, gravid, appropriate for gestational age. Pain/Pressure: Present     Pelvic:  Cervical exam deferred        Extremities: Normal range of motion.  Edema: None  Mental Status: Normal mood and affect. Normal behavior. Normal judgment and thought content.   Assessment and Plan:  Pregnancy: G1P0000 at 3234w5d  1. Supervision of normal first pregnancy, antepartum US images reviewed, has had nl screening, NT First screen and AFP  Preterm labor symptoms and general obstetric precautions including but not limited to vaginal bleeding, contractions, leaking of fluid and fetal movement were reviewed in detail with the patient. Please refer to After Visit Summary for other counseling recommendations.  Return in about 4 weeks (around  02/01/2016).   Adam PhenixJames G Decarla Siemen, MD

## 2016-01-22 NOTE — L&D Delivery Note (Signed)
Delivery Note At 6:01 AM a viable female was delivered via Vaginal, Spontaneous Delivery (Presentation: LOA).  APGAR: 7, 9; weight pending.   Placenta status: Delivered intact with gentle traction .  Cord: 3 vessels with the following complications: None.  Cord pH: Not collected   Anesthesia:  Epidural Episiotomy: None Lacerations: None Suture Repair: N/A Est. Blood Loss (mL): 150  Mom to postpartum.  Baby to Couplet care / Skin to Skin.  Lovena Neighbours, PGY-1 05/14/2016, 6:27 AM  Midwife attestation: I was gloved and present for delivery in its entirety and I agree with the above resident's note.  Donette Larry, CNM 8:44 AM

## 2016-01-31 ENCOUNTER — Ambulatory Visit (INDEPENDENT_AMBULATORY_CARE_PROVIDER_SITE_OTHER): Payer: Medicaid Other | Admitting: Student

## 2016-01-31 VITALS — BP 104/64 | HR 77 | Wt 136.8 lb

## 2016-01-31 DIAGNOSIS — R8271 Bacteriuria: Secondary | ICD-10-CM

## 2016-01-31 DIAGNOSIS — O358XX0 Maternal care for other (suspected) fetal abnormality and damage, not applicable or unspecified: Secondary | ICD-10-CM | POA: Insufficient documentation

## 2016-01-31 DIAGNOSIS — Z3400 Encounter for supervision of normal first pregnancy, unspecified trimester: Secondary | ICD-10-CM

## 2016-01-31 DIAGNOSIS — O283 Abnormal ultrasonic finding on antenatal screening of mother: Secondary | ICD-10-CM

## 2016-01-31 DIAGNOSIS — O358XX1 Maternal care for other (suspected) fetal abnormality and damage, fetus 1: Secondary | ICD-10-CM

## 2016-01-31 DIAGNOSIS — O35BXX1 Maternal care for other (suspected) fetal abnormality and damage, fetal cardiac anomalies, fetus 1: Secondary | ICD-10-CM | POA: Insufficient documentation

## 2016-01-31 DIAGNOSIS — O35EXX0 Maternal care for other (suspected) fetal abnormality and damage, fetal genitourinary anomalies, not applicable or unspecified: Secondary | ICD-10-CM | POA: Insufficient documentation

## 2016-01-31 MED ORDER — NITROFURANTOIN MONOHYD MACRO 100 MG PO CAPS: 100.0000 mg | ORAL_CAPSULE | Freq: Two times a day (BID) | ORAL | 0 refills | Status: AC

## 2016-01-31 NOTE — Patient Instructions (Signed)
Urinary Tract Infection, Adult Introduction A urinary tract infection (UTI) is an infection of any part of the urinary tract. The urinary tract includes the:  Kidneys.  Ureters.  Bladder.  Urethra. These organs make, store, and get rid of pee (urine) in the body. Follow these instructions at home:  Take over-the-counter and prescription medicines only as told by your doctor.  If you were prescribed an antibiotic medicine, take it as told by your doctor. Do not stop taking the antibiotic even if you start to feel better.  Avoid the following drinks:  Alcohol.  Caffeine.  Tea.  Carbonated drinks.  Drink enough fluid to keep your pee clear or pale yellow.  Keep all follow-up visits as told by your doctor. This is important.  Make sure to:  Empty your bladder often and completely. Do not to hold pee for long periods of time.  Empty your bladder before and after sex.  Wipe from front to back after a bowel movement if you are female. Use each tissue one time when you wipe. Contact a doctor if:  You have back pain.  You have a fever.  You feel sick to your stomach (nauseous).  You throw up (vomit).  Your symptoms do not get better after 3 days.  Your symptoms go away and then come back. Get help right away if:  You have very bad back pain.  You have very bad lower belly (abdominal) pain.  You are throwing up and cannot keep down any medicines or water. This information is not intended to replace advice given to you by your health care provider. Make sure you discuss any questions you have with your health care provider. Document Released: 06/26/2007 Document Revised: 06/15/2015 Document Reviewed: 11/28/2014  2017 Elsevier  

## 2016-01-31 NOTE — Progress Notes (Addendum)
   PRENATAL VISIT NOTE  Subjective:  Kristina JarvisJada Reyes is a 21 y.o. G1P0000 at 6727w4d being seen today for ongoing prenatal care.  She is currently monitored for the following issues for this low-risk pregnancy and has Supervision of normal first pregnancy, antepartum; Threatened abortion in early pregnancy; GBS bacteriuria; History of marijuana use; Echogenic focus of heart of fetus affecting antepartum care of mother, fetus 1; and Pyelectasis of fetus on prenatal ultrasound on her problem list.  Patient reports no complaints.  Contractions: Not present. Vag. Bleeding: None.  Movement: Present. Denies leaking of fluid.   The following portions of the patient's history were reviewed and updated as appropriate: allergies, current medications, past family history, past medical history, past social history, past surgical history and problem list. Problem list updated.  Objective:   Vitals:   01/31/16 0814  BP: 104/64  Pulse: 77  Weight: 62.1 kg (136 lb 12.8 oz)    Fetal Status: Fetal Heart Rate (bpm): 155 Fundal Height: 24 cm Movement: Present     General:  Alert, oriented and cooperative. Patient is in no acute distress.  Skin: Skin is warm and dry. No rash noted.   Cardiovascular: Normal heart rate noted  Respiratory: Normal respiratory effort, no problems with respiration noted  Abdomen: Soft, gravid, appropriate for gestational age. Pain/Pressure: Present     Pelvic:  Cervical exam deferred        Extremities: Normal range of motion.  Edema: None  Mental Status: Normal mood and affect. Normal behavior. Normal judgment and thought content.   Assessment and Plan:  Pregnancy: G1P0000 at 1727w4d Feeling well today; no bleeding since first episode. US on 01-03-2017 did not show any evidence of subchorionic hemorrhage. Patient had many questions about breastfeeding and alcohol use (she wants to know the best way to have an occasional alcoholic beverage postpartum). We discussed harm reduction  (limiting the number of drinks, breastfeeding before consuming alcohol, making sure that the baby has another adult care-taker around). Patient verbalized understanding.    1. Supervision of normal first pregnancy, antepartum  - US MFM OB FOLLOW UP; Future to examine renal pyelectasis and echogenic focus -Discussed meannig of Echogenic focus and renal pyelectasis. Informed patient that these are common findings on US and that the next US will tell us more. Patient verbalized reassured.  2. GBS bacteriuria -Patient did not take her macrobid or Keflex earlier. She says that she cannot tolerate pills; she was asking for a cream. Explained thoroughly how cream will not cure GBS. She agrees to try macrobid again in food after crushing the pills.   - US MFM OB FOLLOW UP; Future  Preterm labor symptoms and general obstetric precautions including but not limited to vaginal bleeding, contractions, leaking of fluid and fetal movement were reviewed in detail with the patient. Please refer to After Visit Summary for other counseling recommendations.  Return in about 2 weeks (around 02/14/2016).   Marylene LandKathryn Lorraine Kadien Lineman, CNM

## 2016-02-14 ENCOUNTER — Encounter: Payer: Medicaid Other | Admitting: Advanced Practice Midwife

## 2016-02-28 ENCOUNTER — Encounter: Payer: Self-pay | Admitting: General Practice

## 2016-02-28 ENCOUNTER — Ambulatory Visit (INDEPENDENT_AMBULATORY_CARE_PROVIDER_SITE_OTHER): Payer: Medicaid Other | Admitting: Obstetrics and Gynecology

## 2016-02-28 VITALS — BP 107/75 | HR 98 | Wt 138.7 lb

## 2016-02-28 DIAGNOSIS — O358XX0 Maternal care for other (suspected) fetal abnormality and damage, not applicable or unspecified: Secondary | ICD-10-CM

## 2016-02-28 DIAGNOSIS — O358XX1 Maternal care for other (suspected) fetal abnormality and damage, fetus 1: Secondary | ICD-10-CM

## 2016-02-28 DIAGNOSIS — R8271 Bacteriuria: Secondary | ICD-10-CM

## 2016-02-28 DIAGNOSIS — Z3400 Encounter for supervision of normal first pregnancy, unspecified trimester: Secondary | ICD-10-CM

## 2016-02-28 DIAGNOSIS — Z3402 Encounter for supervision of normal first pregnancy, second trimester: Secondary | ICD-10-CM

## 2016-02-28 DIAGNOSIS — O35BXX1 Maternal care for other (suspected) fetal abnormality and damage, fetal cardiac anomalies, fetus 1: Secondary | ICD-10-CM

## 2016-02-28 NOTE — Progress Notes (Signed)
   PRENATAL VISIT NOTE  Subjective:  Kristina Reyes is a 21 y.o. G1P0000 at 4456w4d being seen today for ongoing prenatal care.  She is currently monitored for the following issues for this low-risk pregnancy and has Supervision of normal first pregnancy, antepartum; GBS bacteriuria; History of marijuana use; Echogenic focus of heart of fetus affecting antepartum care of mother, fetus 1; and Pyelectasis of fetus on prenatal ultrasound on her problem list.  Patient reports no complaints.  Contractions: Not present. Vag. Bleeding: None.  Movement: Present. Denies leaking of fluid.   The following portions of the patient's history were reviewed and updated as appropriate: allergies, current medications, past family history, past medical history, past social history, past surgical history and problem list. Problem list updated.  Objective:   Vitals:   02/28/16 0752  BP: 107/75  Pulse: 98  Weight: 138 lb 11.2 oz (62.9 kg)    Fetal Status: Fetal Heart Rate (bpm): 150 Fundal Height: 27 cm Movement: Present     General:  Alert, oriented and cooperative. Patient is in no acute distress.  Skin: Skin is warm and dry. No rash noted.   Cardiovascular: Normal heart rate noted  Respiratory: Normal respiratory effort, no problems with respiration noted  Abdomen: Soft, gravid, appropriate for gestational age. Pain/Pressure: Present     Pelvic:  Cervical exam deferred        Extremities: Normal range of motion.  Edema: None  Mental Status: Normal mood and affect. Normal behavior. Normal judgment and thought content.   Assessment and Plan:  Pregnancy: G1P0000 at 2256w4d  1. Supervision of normal first pregnancy, antepartum  - Declined Tdap vaccine today; requested information about the vaccine. Plans to get it next week   when she comes for 2 hour GTT - GTT scheduled for next week; patient ate breakfast this morning.  - Dental letter included in DC paper work; Charity fundraiserN to fax sheet to dentist as well.   2. GBS  bacteriuria  - Patient started Macrobid on 2/5; will need urine culture once completed.   3. Echogenic focus of heart of fetus affecting antepartum care of mother, fetus 1  - Patient scheduled for US next week 2/14  Term labor symptoms and general obstetric precautions including but not limited to vaginal bleeding, contractions, leaking of fluid and fetal movement were reviewed in detail with the patient. Please refer to After Visit Summary for other counseling recommendations.  No Follow-up on file.   Duane LopeJennifer I Rasch, NP

## 2016-02-28 NOTE — Patient Instructions (Addendum)
DTaP Vaccine (Diphtheria, Tetanus, and Pertussis): What You Need to Know 1. Why get vaccinated? Diphtheria, tetanus, and pertussis are serious diseases caused by bacteria. Diphtheria and pertussis are spread from person to person. Tetanus enters the body through cuts or wounds. DIPHTHERIA causes a thick covering in the back of the throat.  It can lead to breathing problems, paralysis, heart failure, and even death. TETANUS (Lockjaw) causes painful tightening of the muscles, usually all over the body.  It can lead to "locking" of the jaw so the victim cannot open his mouth or swallow. Tetanus leads to death in up to 2 out of 10 cases. PERTUSSIS (Whooping Cough) causes coughing spells so bad that it is hard for infants to eat, drink, or breathe. These spells can last for weeks.  It can lead to pneumonia, seizures (jerking and staring spells), brain damage, and death. Diphtheria, tetanus, and pertussis vaccine (DTaP) can help prevent these diseases. Most children who are vaccinated with DTaP will be protected throughout childhood. Many more children would get these diseases if we stopped vaccinating. DTaP is a safer version of an older vaccine called DTP. DTP is no longer used in the [place]. 2. Who should get DTaP vaccine and when? Children should get 5 doses of DTaP vaccine, one dose at each of the following ages:  2 months  4 months  6 months  15-18 months  4-6 years DTaP may be given at the same time as other vaccines. 3. Some children should not get DTaP vaccine or should wait  Children with minor illnesses, such as a cold, may be vaccinated. But children who are moderately or severely ill should usually wait until they recover before getting DTaP vaccine.  Any child who had a life-threatening allergic reaction after a dose of DTaP should not get another dose.  Any child who suffered a brain or nervous system disease within 7 days after a dose of DTaP should not get another  dose.  Talk with your doctor if your child:  had a seizure or collapsed after a dose of DTaP,  cried non-stop for 3 hours or more after a dose of DTaP,  had a fever over 105F after a dose of DTaP. Ask your doctor for more information. Some of these children should not get another dose of pertussis vaccine, but may get a vaccine without pertussis, called DT. 4. Older children and adults DTaP is not licensed for adolescents, adults, or children 70 years of age and older. But older people still need protection. A vaccine called Tdap is similar to DTaP. A single dose of Tdap is recommended for people 11 through 22 years of age. Another vaccine, called Td, protects against tetanus and diphtheria, but not pertussis. It is recommended every 10 years. There are separate Vaccine Information Statements for these vaccines. 5. What are the risks from DTaP vaccine? Getting diphtheria, tetanus, or pertussis disease is much riskier than getting DTaP vaccine. However, a vaccine, like any medicine, is capable of causing serious problems, such as severe allergic reactions. The risk of DTaP vaccine causing serious harm, or death, is extremely small. Mild problems (common)  Fever (up to about 1 child in 4)  Redness or swelling where the shot was given (up to about 1 child in 4)  Soreness or tenderness where the shot was given (up to about 1 child in 4) These problems occur more often after the 4th and 5th doses of the DTaP series than after earlier doses. Sometimes the  4th or 5th dose of DTaP vaccine is followed by swelling of the entire arm or leg in which the shot was given, lasting 1-7 days (up to about 1 child in 30). Other mild problems include:  Fussiness (up to about 1 child in 3)  Tiredness or poor appetite (up to about 1 child in 10)  Vomiting (up to about 1 child in 50) These problems generally occur 1-3 days after the shot. Moderate problems (uncommon)  Seizure (jerking or staring) (about 1  child out of 14,000)  Non-stop crying, for 3 hours or more (up to about 1 child out of 1,000)  High fever, over 105F (about 1 child out of 16,000) Severe problems (very rare)  Serious allergic reaction (less than 1 out of a million doses)  Several other severe problems have been reported after DTaP vaccine. These include:  Long-term seizures, coma, or lowered consciousness  Permanent brain damage. These are so rare it is hard to tell if they are caused by the vaccine. Controlling fever is especially important for children who have had seizures, for any reason. It is also important if another family member has had seizures. You can reduce fever and pain by giving your child an aspirin-free pain reliever when the shot is given, and for the next 24 hours, following the package instructions. 6. What if there is a serious reaction? What should I look for? Look for anything that concerns you, such as signs of a severe allergic reaction, very high fever, or behavior changes. Signs of a severe allergic reaction can include hives, swelling of the face and throat, difficulty breathing, a fast heartbeat, dizziness, and weakness. These would start a few minutes to a few hours after the vaccination. What should I do?  If you think it is a severe allergic reaction or other emergency that can't wait, call 9-1-1 or get the person to the nearest hospital. Otherwise, call your doctor.  Afterward, the reaction should be reported to the Vaccine Adverse Event Reporting System (VAERS). Your doctor might file this report, or you can do it yourself through the VAERS web site at www.vaers.LAgents.no, or by calling 1-(682)258-5728.  VAERS is only for reporting reactions. They do not give medical advice. 7. The National Vaccine Injury Compensation Program The [employer] Vaccine Injury Compensation Program (VICP) is a federal program that was created to compensate people who may have been injured by certain  vaccines. Persons who believe they may have been injured by a vaccine can learn about the program and about filing a claim by calling 1-915-675-3670 or visiting the VICP website at SpiritualWord.at. 8. How can I learn more?  Ask your doctor.  Call your local or state health department.  Contact the Centers for Disease Control and Prevention (CDC):  Call 615-860-8452 (1-800-CDC-INFO) or  Visit CDC's website at PicCapture.uy CDC DTaP Vaccine (Diphtheria, Tetanus, and Pertussis) VIS (06/06/05) This information is not intended to replace advice given to you by your health care provider. Make sure you discuss any questions you have with your health care provider. Document Released: 11/04/2005 Document Revised: 09/28/2015 Document Reviewed: 09/28/2015 Elsevier Interactive Patient Education  2017 Elsevier Inc.      02/28/2016  To Whom It May Concern:   Kristina Reyes, DOB 09/18/1995 is currently receiving prenatal care in our office.  We are referring her to you for dental treatment.  She may have all the usual dental procedures as if she were not pregnant. This includes shielded dental x-rays and local infiltration anesthesia  with Novocaine with Epinephrine.  She may also have the usual narcotic analgesia and antibiotics as needed.   Sincerely,   Duane LopeJennifer I Ladasha Schnackenberg, NP  [facility]

## 2016-03-05 ENCOUNTER — Inpatient Hospital Stay (HOSPITAL_COMMUNITY)
Admission: AD | Admit: 2016-03-05 | Discharge: 2016-03-06 | Disposition: A | Discharge status: Home / Self Care | Payer: Medicaid Other | Source: Ambulatory Visit | Attending: Obstetrics & Gynecology | Admitting: Obstetrics & Gynecology

## 2016-03-05 ENCOUNTER — Encounter (HOSPITAL_COMMUNITY): Payer: Self-pay

## 2016-03-05 DIAGNOSIS — O26893 Other specified pregnancy related conditions, third trimester: Secondary | ICD-10-CM | POA: Insufficient documentation

## 2016-03-05 DIAGNOSIS — R102 Pelvic and perineal pain: Secondary | ICD-10-CM | POA: Insufficient documentation

## 2016-03-05 DIAGNOSIS — Z3A28 28 weeks gestation of pregnancy: Secondary | ICD-10-CM | POA: Insufficient documentation

## 2016-03-05 DIAGNOSIS — N949 Unspecified condition associated with female genital organs and menstrual cycle: Secondary | ICD-10-CM

## 2016-03-05 DIAGNOSIS — K5900 Constipation, unspecified: Secondary | ICD-10-CM | POA: Insufficient documentation

## 2016-03-05 NOTE — MAU Provider Note (Signed)
Chief Complaint:  Abdominal Pain and Back Pain   First Provider Initiated Contact with Patient 03/05/16 2339      HPI: Kristina JarvisJada Reyes is a 21 y.o. G1P0000 at 1338w3d who presents to maternity admissions reporting abdominal pain rated 8/10 that has been worsening since 1000. Worse with movement and denies contractions, leakage of fluid or vaginal bleeding. Good fetal movement.  Last BM was yesterday, but was hard and with minimal output.   Pregnancy Course:   Past Medical History: Past Medical History:  Diagnosis Date  . Asthma     Past obstetric history: OB History  Gravida Para Term Preterm AB Living  1 0 0 0 0 0  SAB TAB Ectopic Multiple Live Births  0 0 0 0      # Outcome Date GA Lbr Len/2nd Weight Sex Delivery Anes PTL Lv  1 Current               Past Surgical History: Past Surgical History:  Procedure Laterality Date  . arm surgery Left    BB removed from left forearm.      Family History: Family History  Problem Relation Age of Onset  . Asthma Mother   . Asthma Brother     Social History: Social History  Substance Use Topics  . Smoking status: Never Smoker  . Smokeless tobacco: Never Used  . Alcohol use No    Allergies: No Known Allergies  Meds:  Prescriptions Prior to Admission  Medication Sig Dispense Refill Last Dose  . acetaminophen (TYLENOL) 160 MG/5ML liquid Take 325 mg by mouth every 4 (four) hours as needed for fever or pain.   Taking  . nitrofurantoin, macrocrystal-monohydrate, (MACROBID) 100 MG capsule Take 1 capsule (100 mg total) by mouth 2 (two) times daily. (Patient not taking: Reported on 01/31/2016) 14 capsule 0 Not Taking  . Prenatal MV-Min-Fe Fum-FA-DHA (PRENATAL 1 PO) Take 1 tablet by mouth daily.   Taking    I have reviewed patient's Past Medical Hx, Surgical Hx, Family Hx, Social Hx, medications and allergies.   ROS:  A comprehensive ROS was negative except per HPI.    Physical Exam   Patient Vitals for the past 24 hrs:  BP Temp  Temp src Pulse Resp Height  03/05/16 2305 96/58 98.5 F (36.9 C) Oral 87 17 5\' 7"  (1.702 m)   Constitutional: Well-developed, well-nourished female in no acute distress.  Cardiovascular: normal rate Respiratory: normal effort GI: Abd soft, mildly tender LLQ, no peritoneal signs, gravid abd appropriate for gestational age MS: Extremities nontender, no edema, normal ROM Neurologic: Alert and oriented x 4.  GU: Neg CVAT.  Pelvic: NEFG, cervix closed. No CMT  Dilation: Closed Exam by:: Elwanda Brooklynaniel Warden, MD  FHT:  Baseline 150s , moderate variability, no accels or decelerations Contractions: none   Labs: Results for orders placed or performed during the hospital encounter of 03/05/16 (from the past 24 hour(s))  Urinalysis, Routine w reflex microscopic     Status: Abnormal   Collection Time: 03/06/16 12:20 AM  Result Value Ref Range   Color, Urine YELLOW YELLOW   APPearance CLOUDY (A) CLEAR   Specific Gravity, Urine 1.019 1.005 - 1.030   pH 7.0 5.0 - 8.0   Glucose, UA NEGATIVE NEGATIVE mg/dL   Hgb urine dipstick NEGATIVE NEGATIVE   Bilirubin Urine NEGATIVE NEGATIVE   Ketones, ur NEGATIVE NEGATIVE mg/dL   Protein, ur NEGATIVE NEGATIVE mg/dL   Nitrite NEGATIVE NEGATIVE   Leukocytes, UA NEGATIVE NEGATIVE  Imaging:  No results found.  MAU Course: Fetal monitoring: Baseline HR 150s, mod variability, no accels or decels, no contractions UA: normal   MDM: Plan of care reviewed with patient, including labs and tests ordered and medical treatment.   Assessment: 1. Constipation, unspecified constipation type   2. Round ligament pain   20yo F [redacted]w[redacted]d presenting with LLQ pain that started this morning that is sharp in nature and associated with ambulation.  LLQ mildly TTP without peritoneal signs. Last BM yesterday, but reported it was dry and minimal output. Typically has BM every other day.  Likely to be constipation vs: round ligament pain.  Cervix closed on exam. No vaginal  bleeding, LOF or discharge.   Plan: Discharge home in stable condition.  Prescribed stool softener and recommending tylenol and support belt.  Patient has follow up appointment tomorrow.  Preterm labor precautions and fetal kick counts discussed with patient.     Allergies as of 03/06/2016   No Known Allergies     Medication List    STOP taking these medications   nitrofurantoin (macrocrystal-monohydrate) 100 MG capsule Commonly known as:  MACROBID     TAKE these medications   acetaminophen 160 MG/5ML liquid Commonly known as:  TYLENOL Take 325 mg by mouth every 4 (four) hours as needed for fever or pain.   docusate sodium 100 MG capsule Commonly known as:  COLACE Take 1 capsule (100 mg total) by mouth 2 (two) times daily.   PRENATAL 1 PO Take 1 tablet by mouth daily.       Kristina Musca, MD PGY-1 03/06/2016 1:07 AM   OB FELLOW MAU DISCHARGE ATTESTATION  I have seen and examined this patient; I agree with above documentation in the resident's note.   Patient came in complaining of occasional left sided abdominal pain. Occurs when she stands up and/or walks, it is constant (does not come/go like contraction), sharp feeling. Sitting makes it feel better. Denies N/V/D, F/C, VB, LOF, VD, urinary complaints. Admits to hard pebble like stools, but stools every other day. Has good fetal movement.  PE: NAD, comfortable in bed. Slightly tender to palpation on left abdominal side, without flank pain or CVAT.  No suprapubic tenderness. No uterine tenderness.  SVE: closed/thick/posterior/high  I agree with assessment/plan, likely constipation vs round ligament pain. Stool softeners given for home.   Preterm labor precautions given. Has appt today in office.   I personally reviewed the patient's NST today, found to be REACTIVE. 150 bpm, mod var, +accels, no decels. CTX: None.   Kristina Mow, DO OB Fellow 1:33 AM

## 2016-03-05 NOTE — MAU Note (Signed)
Patient presents with pain 8/10 in her lower abdomen and back that started 1000 that gets worse with movement.  Reports no bleeding or discharge, good fetal movement.  Last BM last night.  Has not taken anything for the pain.

## 2016-03-06 ENCOUNTER — Other Ambulatory Visit: Payer: Self-pay | Admitting: Student

## 2016-03-06 ENCOUNTER — Other Ambulatory Visit: Payer: Medicaid Other

## 2016-03-06 ENCOUNTER — Ambulatory Visit (HOSPITAL_COMMUNITY)
Admission: RE | Admit: 2016-03-06 | Discharge: 2016-03-06 | Disposition: A | Discharge status: Home / Self Care | Payer: Medicaid Other | Source: Ambulatory Visit | Attending: Student | Admitting: Student

## 2016-03-06 ENCOUNTER — Encounter: Payer: Medicaid Other | Admitting: Advanced Practice Midwife

## 2016-03-06 DIAGNOSIS — R109 Unspecified abdominal pain: Secondary | ICD-10-CM | POA: Diagnosis present

## 2016-03-06 DIAGNOSIS — Z362 Encounter for other antenatal screening follow-up: Secondary | ICD-10-CM | POA: Diagnosis not present

## 2016-03-06 DIAGNOSIS — O358XX0 Maternal care for other (suspected) fetal abnormality and damage, not applicable or unspecified: Secondary | ICD-10-CM | POA: Diagnosis not present

## 2016-03-06 DIAGNOSIS — O9989 Other specified diseases and conditions complicating pregnancy, childbirth and the puerperium: Secondary | ICD-10-CM

## 2016-03-06 DIAGNOSIS — Z3400 Encounter for supervision of normal first pregnancy, unspecified trimester: Secondary | ICD-10-CM

## 2016-03-06 DIAGNOSIS — R8271 Bacteriuria: Secondary | ICD-10-CM

## 2016-03-06 DIAGNOSIS — K5900 Constipation, unspecified: Secondary | ICD-10-CM | POA: Diagnosis not present

## 2016-03-06 DIAGNOSIS — R102 Pelvic and perineal pain: Secondary | ICD-10-CM | POA: Diagnosis not present

## 2016-03-06 DIAGNOSIS — Z3A28 28 weeks gestation of pregnancy: Secondary | ICD-10-CM | POA: Insufficient documentation

## 2016-03-06 DIAGNOSIS — Z363 Encounter for antenatal screening for malformations: Secondary | ICD-10-CM

## 2016-03-06 DIAGNOSIS — O26893 Other specified pregnancy related conditions, third trimester: Secondary | ICD-10-CM | POA: Diagnosis not present

## 2016-03-06 LAB — URINALYSIS, ROUTINE W REFLEX MICROSCOPIC
Bilirubin Urine: NEGATIVE
GLUCOSE, UA: NEGATIVE mg/dL
HGB URINE DIPSTICK: NEGATIVE
KETONES UR: NEGATIVE mg/dL
Leukocytes, UA: NEGATIVE
Nitrite: NEGATIVE
PROTEIN: NEGATIVE mg/dL
Specific Gravity, Urine: 1.019 (ref 1.005–1.030)
pH: 7 (ref 5.0–8.0)

## 2016-03-06 MED ORDER — DOCUSATE SODIUM 100 MG PO CAPS: 100.0000 mg | ORAL_CAPSULE | Freq: Two times a day (BID) | ORAL | 0 refills | Status: DC

## 2016-03-06 NOTE — Discharge Instructions (Signed)
Round Ligament Pain Introduction The round ligament is a cord of muscle and tissue that helps to support the uterus. It can become a source of pain during pregnancy if it becomes stretched or twisted as the baby grows. The pain usually begins in the second trimester of pregnancy, and it can come and go until the baby is delivered. It is not a serious problem, and it does not cause harm to the baby. Round ligament pain is usually a short, sharp, and pinching pain, but it can also be a dull, lingering, and aching pain. The pain is felt in the lower side of the abdomen or in the groin. It usually starts deep in the groin and moves up to the outside of the hip area. Pain can occur with:  A sudden change in position.  Rolling over in bed.  Coughing or sneezing.  Physical activity. Follow these instructions at home: Watch your condition for any changes. Take these steps to help with your pain:  When the pain starts, relax. Then try:  Sitting down.  Flexing your knees up to your abdomen.  Lying on your side with one pillow under your abdomen and another pillow between your legs.  Sitting in a warm bath for 15-20 minutes or until the pain goes away.  Take over-the-counter and prescription medicines only as told by your health care provider.  Move slowly when you sit and stand.  Avoid long walks if they cause pain.  Stop or lessen your physical activities if they cause pain. Contact a health care provider if:  Your pain does not go away with treatment.  You feel pain in your back that you did not have before.  Your medicine is not helping. Get help right away if:  You develop a fever or chills.  You develop uterine contractions.  You develop vaginal bleeding.  You develop nausea or vomiting.  You develop diarrhea.  You have pain when you urinate. This information is not intended to replace advice given to you by your health care provider. Make sure you discuss any questions  you have with your health care provider. Document Released: 10/17/2007 Document Revised: 06/15/2015 Document Reviewed: 03/16/2014  2017 Elsevier  

## 2016-03-12 ENCOUNTER — Encounter: Payer: Medicaid Other | Admitting: Advanced Practice Midwife

## 2016-03-13 ENCOUNTER — Ambulatory Visit (INDEPENDENT_AMBULATORY_CARE_PROVIDER_SITE_OTHER): Payer: Medicaid Other | Admitting: Advanced Practice Midwife

## 2016-03-13 VITALS — BP 106/74 | HR 82 | Wt 138.7 lb

## 2016-03-13 DIAGNOSIS — O35EXX0 Maternal care for other (suspected) fetal abnormality and damage, fetal genitourinary anomalies, not applicable or unspecified: Secondary | ICD-10-CM

## 2016-03-13 DIAGNOSIS — O283 Abnormal ultrasonic finding on antenatal screening of mother: Secondary | ICD-10-CM

## 2016-03-13 DIAGNOSIS — J45909 Unspecified asthma, uncomplicated: Secondary | ICD-10-CM

## 2016-03-13 DIAGNOSIS — O35BXX1 Maternal care for other (suspected) fetal abnormality and damage, fetal cardiac anomalies, fetus 1: Secondary | ICD-10-CM

## 2016-03-13 DIAGNOSIS — Z3480 Encounter for supervision of other normal pregnancy, unspecified trimester: Secondary | ICD-10-CM

## 2016-03-13 DIAGNOSIS — O2342 Unspecified infection of urinary tract in pregnancy, second trimester: Secondary | ICD-10-CM

## 2016-03-13 DIAGNOSIS — O99519 Diseases of the respiratory system complicating pregnancy, unspecified trimester: Secondary | ICD-10-CM

## 2016-03-13 DIAGNOSIS — O99512 Diseases of the respiratory system complicating pregnancy, second trimester: Secondary | ICD-10-CM

## 2016-03-13 DIAGNOSIS — O358XX0 Maternal care for other (suspected) fetal abnormality and damage, not applicable or unspecified: Secondary | ICD-10-CM

## 2016-03-13 DIAGNOSIS — O358XX1 Maternal care for other (suspected) fetal abnormality and damage, fetus 1: Secondary | ICD-10-CM

## 2016-03-13 MED ORDER — ALBUTEROL SULFATE HFA 108 (90 BASE) MCG/ACT IN AERS: 2.0000 | INHALATION_SPRAY | Freq: Four times a day (QID) | RESPIRATORY_TRACT | 5 refills | Status: DC | PRN

## 2016-03-13 NOTE — Progress Notes (Signed)
Pt requesting inhaler for her asthma.

## 2016-03-13 NOTE — Patient Instructions (Addendum)
Asthma, Adult Asthma is a recurring condition in which the airways tighten and narrow. Asthma can make it difficult to breathe. It can cause coughing, wheezing, and shortness of breath. Asthma episodes, also called asthma attacks, range from minor to life-threatening. Asthma cannot be cured, but medicines and lifestyle changes can help control it. What are the causes? Asthma is believed to be caused by inherited (genetic) and environmental factors, but its exact cause is unknown. Asthma may be triggered by allergens, lung infections, or irritants in the air. Asthma triggers are different for each person. Common triggers include:  Animal dander.  Dust mites.  Cockroaches.  Pollen from trees or grass.  Mold.  Smoke.  Air pollutants such as dust, household cleaners, hair sprays, aerosol sprays, paint fumes, strong chemicals, or strong odors.  Cold air, weather changes, and winds (which increase molds and pollens in the air).  Strong emotional expressions such as crying or laughing hard.  Stress.  Certain medicines (such as aspirin) or types of drugs (such as beta-blockers).  Sulfites in foods and drinks. Foods and drinks that may contain sulfites include dried fruit, potato chips, and sparkling grape juice.  Infections or inflammatory conditions such as the flu, a cold, or an inflammation of the nasal membranes (rhinitis).  Gastroesophageal reflux disease (GERD).  Exercise or strenuous activity.  What are the signs or symptoms? Symptoms may occur immediately after asthma is triggered or many hours later. Symptoms include:  Wheezing.  Excessive nighttime or early morning coughing.  Frequent or severe coughing with a common cold.  Chest tightness.  Shortness of breath.  How is this diagnosed? The diagnosis of asthma is made by a review of your medical history and a physical exam. Tests may also be performed. These may include:  Lung function studies. These tests show how  much air you breathe in and out.  Allergy tests.  Imaging tests such as X-rays.  How is this treated? Asthma cannot be cured, but it can usually be controlled. Treatment involves identifying and avoiding your asthma triggers. It also involves medicines. There are 2 classes of medicine used for asthma treatment:  Controller medicines. These prevent asthma symptoms from occurring. They are usually taken every day.  Reliever or rescue medicines. These quickly relieve asthma symptoms. They are used as needed and provide short-term relief.  Your health care provider will help you create an asthma action plan. An asthma action plan is a written plan for managing and treating your asthma attacks. It includes a list of your asthma triggers and how they may be avoided. It also includes information on when medicines should be taken and when their dosage should be changed. An action plan may also involve the use of a device called a peak flow meter. A peak flow meter measures how well the lungs are working. It helps you monitor your condition. Follow these instructions at home:  Take medicines only as directed by your health care provider. Speak with your health care provider if you have questions about how or when to take the medicines.  Use a peak flow meter as directed by your health care provider. Record and keep track of readings.  Understand and use the action plan to help minimize or stop an asthma attack without needing to seek medical care.  Control your home environment in the following ways to help prevent asthma attacks: ? Do not smoke. Avoid being exposed to secondhand smoke. ? Change your heating and air conditioning filter regularly. ? Limit   and wood stoves.  Get rid of pests (such as roaches and mice) and their droppings.  Throw away plants if you see mold on them.  Clean your floors and dust regularly. Use unscented cleaning products.  Try to have someone  else vacuum for you regularly. Stay out of rooms while they are being vacuumed and for a short while afterward. If you vacuum, use a dust mask from a hardware store, a double-layered or microfilter vacuum cleaner bag, or a vacuum cleaner with a HEPA filter.  Replace carpet with wood, tile, or vinyl flooring. Carpet can trap dander and dust.  Use allergy-proof pillows, mattress covers, and box spring covers.  Wash bed sheets and blankets every week in hot water and dry them in a dryer.  Use blankets that are made of polyester or cotton.  Clean bathrooms and kitchens with bleach. If possible, have someone repaint the walls in these rooms with mold-resistant paint. Keep out of the rooms that are being cleaned and painted.  Wash hands frequently. Contact a health care provider if:  You have wheezing, shortness of breath, or a cough even if taking medicine to prevent attacks.  The colored mucus you cough up (sputum) is thicker than usual.  Your sputum changes from clear or white to yellow, green, gray, or bloody.  You have any problems that may be related to the medicines you are taking (such as a rash, itching, swelling, or trouble breathing).  You are using a reliever medicine more than 2-3 times per week.  Your peak flow is still at 50-79% of your personal best after following your action plan for 1 hour.  You have a fever. Get help right away if:  You seem to be getting worse and are unresponsive to treatment during an asthma attack.  You are short of breath even at rest.  You get short of breath when doing very little physical activity.  You have difficulty eating, drinking, or talking due to asthma symptoms.  You develop chest pain.  You develop a fast heartbeat.  You have a bluish color to your lips or fingernails.  You are light-headed, dizzy, or faint.  Your peak flow is less than 50% of your personal best. This information is not intended to replace advice given to  you by your health care provider. Make sure you discuss any questions you have with your health care provider. Document Released: 01/07/2005 Document Revised: 06/21/2015 Document Reviewed: 08/06/2012 Elsevier Interactive Patient Education  2017 [employer].  Third Trimester of Pregnancy The third trimester is from week 29 through week 40 (months 7 through 9). The third trimester is a time when the unborn baby (fetus) is growing rapidly. At the end of the ninth month, the fetus is about 20 inches in length and weighs 6-10 pounds. Body changes during your third trimester Your body goes through many changes during pregnancy. The changes vary from woman to woman. During the third trimester:  Your weight will continue to increase. You can expect to gain 25-35 pounds (11-16 kg) by the end of the pregnancy.  You may begin to get stretch marks on your hips, abdomen, and breasts.  You may urinate more often because the fetus is moving lower into your pelvis and pressing on your bladder.  You may develop or continue to have heartburn. This is caused by increased hormones that slow down muscles in the digestive tract.  You may develop or continue to have constipation because increased hormones slow digestion and  cause the muscles that push waste through your intestines to relax.  You may develop hemorrhoids. These are swollen veins (varicose veins) in the rectum that can itch or be painful.  You may develop swollen, bulging veins (varicose veins) in your legs.  You may have increased body aches in the pelvis, back, or thighs. This is due to weight gain and increased hormones that are relaxing your joints.  You may have changes in your hair. These can include thickening of your hair, rapid growth, and changes in texture. Some women also have hair loss during or after pregnancy, or hair that feels dry or thin. Your hair will most likely return to normal after your baby is born.  Your breasts will  continue to grow and they will continue to become tender. A yellow fluid (colostrum) may leak from your breasts. This is the first milk you are producing for your baby.  Your belly button may stick out.  You may notice more swelling in your hands, face, or ankles.  You may have increased tingling or numbness in your hands, arms, and legs. The skin on your belly may also feel numb.  You may feel short of breath because of your expanding uterus.  You may have more problems sleeping. This can be caused by the size of your belly, increased need to urinate, and an increase in your body's metabolism.  You may notice the fetus "dropping," or moving lower in your abdomen.  You may have increased vaginal discharge.  Your cervix becomes thin and soft (effaced) near your due date. What to expect at prenatal visits You will have prenatal exams every 2 weeks until week 36. Then you will have weekly prenatal exams. During a routine prenatal visit:  You will be weighed to make sure you and the fetus are growing normally.  Your blood pressure will be taken.  Your abdomen will be measured to track your baby's growth.  The fetal heartbeat will be listened to.  Any test results from the previous visit will be discussed.  You may have a cervical check near your due date to see if you have effaced. At around 36 weeks, your health care provider will check your cervix. At the same time, your health care provider will also perform a test on the secretions of the vaginal tissue. This test is to determine if a type of bacteria, Group B streptococcus, is present. Your health care provider will explain this further. Your health care provider may ask you:  What your birth plan is.  How you are feeling.  If you are feeling the baby move.  If you have had any abnormal symptoms, such as leaking fluid, bleeding, severe headaches, or abdominal cramping.  If you are using any tobacco products, including  cigarettes, chewing tobacco, and electronic cigarettes.  If you have any questions. Other tests or screenings that may be performed during your third trimester include:  Blood tests that check for low iron levels (anemia).  Fetal testing to check the health, activity level, and growth of the fetus. Testing is done if you have certain medical conditions or if there are problems during the pregnancy.  Nonstress test (NST). This test checks the health of your baby to make sure there are no signs of problems, such as the baby not getting enough oxygen. During this test, a belt is placed around your belly. The baby is made to move, and its heart rate is monitored during movement. What is false labor?  False labor is a condition in which you feel small, irregular tightenings of the muscles in the womb (contractions) that eventually go away. These are called Braxton Hicks contractions. Contractions may last for hours, days, or even weeks before true labor sets in. If contractions come at regular intervals, become more frequent, increase in intensity, or become painful, you should see your health care provider. What are the signs of labor?  Abdominal cramps.  Regular contractions that start at 10 minutes apart and become stronger and more frequent with time.  Contractions that start on the top of the uterus and spread down to the lower abdomen and back.  Increased pelvic pressure and dull back pain.  A watery or bloody mucus discharge that comes from the vagina.  Leaking of amniotic fluid. This is also known as your "water breaking." It could be a slow trickle or a gush. Let your doctor know if it has a color or strange odor. If you have any of these signs, call your health care provider right away, even if it is before your due date. Follow these instructions at home: Eating and drinking  Continue to eat regular, healthy meals.  Do not eat:  Raw meat or meat spreads.  Unpasteurized milk or  cheese.  Unpasteurized juice.  Store-made salad.  Refrigerated smoked seafood.  Hot dogs or deli meat, unless they are piping hot.  More than 6 ounces of albacore tuna a week.  Shark, swordfish, king mackerel, or tile fish.  Store-made salads.  Raw sprouts, such as mung bean or alfalfa sprouts.  Take prenatal vitamins as told by your health care provider.  Take 1000 mg of calcium daily as told by your health care provider.  If you develop constipation:  Take over-the-counter or prescription medicines.  Drink enough fluid to keep your urine clear or pale yellow.  Eat foods that are high in fiber, such as fresh fruits and vegetables, whole grains, and beans.  Limit foods that are high in fat and processed sugars, such as fried and sweet foods. Activity  Exercise only as directed by your health care provider. Healthy pregnant women should aim for 2 hours and 30 minutes of moderate exercise per week. If you experience any pain or discomfort while exercising, stop.  Avoid heavy lifting.  Do not exercise in extreme heat or humidity, or at high altitudes.  Wear low-heel, comfortable shoes.  Practice good posture.  Do not travel far distances unless it is absolutely necessary and only with the approval of your health care provider.  Wear your seat belt at all times while in a car, on a bus, or on a plane.  Take frequent breaks and rest with your legs elevated if you have leg cramps or low back pain.  Do not use hot tubs, steam rooms, or saunas.  You may continue to have sex unless your health care provider tells you otherwise. Lifestyle  Do not use any products that contain nicotine or tobacco, such as cigarettes and e-cigarettes. If you need help quitting, ask your health care provider.  Do not drink alcohol.  Do not use any medicinal herbs or unprescribed drugs. These chemicals affect the formation and growth of the baby.  If you develop varicose veins:  Wear  support pantyhose or compression stockings as told by your healthcare provider.  Elevate your feet for 15 minutes, 3-4 times a day.  Wear a supportive maternity bra to help with breast tenderness. General instructions  Take over-the-counter and prescription  medicines only as told by your health care provider. There are medicines that are either safe or unsafe to take during pregnancy.  Take warm sitz baths to soothe any pain or discomfort caused by hemorrhoids. Use hemorrhoid cream or witch hazel if your health care provider approves.  Avoid cat litter boxes and soil used by cats. These carry germs that can cause birth defects in the baby. If you have a cat, ask someone to clean the litter box for you.  To prepare for the arrival of your baby:  Take prenatal classes to understand, practice, and ask questions about the labor and delivery.  Make a trial run to the hospital.  Visit the hospital and tour the maternity area.  Arrange for maternity or paternity leave through employers.  Arrange for family and friends to take care of pets while you are in the hospital.  Purchase a rear-facing car seat and make sure you know how to install it in your car.  Pack your hospital bag.  Prepare the baby's nursery. Make sure to remove all pillows and stuffed animals from the baby's crib to prevent suffocation.  Visit your dentist if you have not gone during your pregnancy. Use a soft toothbrush to brush your teeth and be gentle when you floss.  Keep all prenatal follow-up visits as told by your health care provider. This is important. Contact a health care provider if:  You are unsure if you are in labor or if your water has broken.  You become dizzy.  You have mild pelvic cramps, pelvic pressure, or nagging pain in your abdominal area.  You have lower back pain.  You have persistent nausea, vomiting, or diarrhea.  You have an unusual or bad smelling vaginal discharge.  You have pain  when you urinate. Get help right away if:  You have a fever.  You are leaking fluid from your vagina.  You have spotting or bleeding from your vagina.  You have severe abdominal pain or cramping.  You have rapid weight loss or weight gain.  You have shortness of breath with chest pain.  You notice sudden or extreme swelling of your face, hands, ankles, feet, or legs.  Your baby makes fewer than 10 movements in 2 hours.  You have severe headaches that do not go away with medicine.  You have vision changes. Summary  The third trimester is from week 29 through week 40, months 7 through 9. The third trimester is a time when the unborn baby (fetus) is growing rapidly.  During the third trimester, your discomfort may increase as you and your baby continue to gain weight. You may have abdominal, leg, and back pain, sleeping problems, and an increased need to urinate.  During the third trimester your breasts will keep growing and they will continue to become tender. A yellow fluid (colostrum) may leak from your breasts. This is the first milk you are producing for your baby.  False labor is a condition in which you feel small, irregular tightenings of the muscles in the womb (contractions) that eventually go away. These are called Braxton Hicks contractions. Contractions may last for hours, days, or even weeks before true labor sets in.  Signs of labor can include: abdominal cramps; regular contractions that start at 10 minutes apart and become stronger and more frequent with time; watery or bloody mucus discharge that comes from the vagina; increased pelvic pressure and dull back pain; and leaking of amniotic fluid. This information is not intended  to replace advice given to you by your health care provider. Make sure you discuss any questions you have with your health care provider. Document Released: 01/01/2001 Document Revised: 06/15/2015 Document Reviewed: 03/10/2012 Elsevier  Interactive Patient Education  2017 [employer].  [facility] Guide (Revised August 2014)   Chronic Pain Problems:  . [facility]:  [id number]           Patients need to be referred by their primary care doctor/specialist  Insufficient Money for Medicine:           United Way: call "211"   . MAP Program at [facility] Department - [place] [phone] or [place] [phone]            No Primary Care Doctor:  To locate a primary care doctor that accepts your insurance or provides certain services:           [place] Connect: [phone]           Physician Referral Service: [phone] ask for "My [place]" . If no insurance, you need to see if you qualify for [facility] "orange card", call to set      up appointment for eligibility/enrollment at [phone] or [phone] or visit [facility]. of Health and [employer] ([address], [place] and [address] [place]) to meet with a [facility] enrollment specialist.  Agencies that provide inexpensive (sliding fee scale) medical care:   Marland Kitchen    [facility] - Family Medicine at [facility] .    [facility]  -  [facility] [phone] .    [facility] - [phone] .    [facility] [phone] .    [facility] [phone] .    [facility] [phone] . [facility] - [facility] [place] [phone]-     [phone] . [facility] @ [place] [phone] . [facility]: [phone]  . Women's Clinic: [phone]  . [facility]: [phone]  . [facility] [place]    Medicaid-accepting [facility] Providers:           [facility] - [phone] (No Family Planning accepted)          [address], [phone], Mon-Fri 9am-5pm          [facility] [phone] . [address] [place], [address], [place] 8am-5pm, Fri 8am-noon . [facility] - [phone]          [address], Mon-Fri 7:30am-4:30pm          [facility] - [phone]          [address], [place] 8am-5pm          [facility] - [phone] [address]          Only accepts [place] [employer] patients after they have their name applied to their card  Self Pay (no insurance) in [facility]:           Sickle Cell Patients:  . [address] [phone] [facility] Internal [facility]: .  [address], [place] [phone]       [facility] . [address], [place] [phone]  [facility]: . [address] [phone]          [facility]           [address] [place], [phone] [facility] . [address] [place], [phone]           [facility] [place]           [address], [place] [phone]        [facility] - [address]           [phone], Mon-Fri 9am-7pm, [place] 9am-1pm          [facility] - Family Medicine @ [facility]          [address] [place], [phone]          [facility]           [address] [phone] [facility] . [address] [place] [phone]          [facility]           [address] [phone]  [facility]  . [address] [place], [phone] [facility] . [address], [phone]  Dr.  Julio Sicks           [address], [place], [phone]          [facility]           [address] [phone]          [facility]             [address] [phone]          [facility]           [address] [place], [phone], 1st & 3rd Saturday every month, 10am-1pm  OTHERS:  [employer]  (Immigration [employer] Only)  [phone] (Thursday only)  Strategies for finding a Primary Care Provider:  1) Find a Doctor and Pay Out of Pocket  Although you won't have to find out who is covered by your insurance plan, it is a good idea to ask around and get recommendations. You will then need to call the office and see if the doctor you have chosen will accept you as a new patient and what types of options they offer for patients who are self-pay. Some doctors offer discounts or will set up payment plans for their patients who do not have insurance, but you will need to ask so you aren't surprised when you get to your appointment.  2) Contact [place] [employer] - To see if you qualify for "orange card" access to healthcare safety net providers.  Call for appointment for eligibility/enrollment at [phone] or [phone]. (Uninsured, 0-200% FPL, qualifying info)  Applicants for [facility] are first required to see if they are eligible to enroll in the [facility] Marketplace before enrolling in [facility] (and get an exemption if they are not).  GCCN Criteria for acceptance is:  ? Proof of [employer] exemption - form or documentation  ? Valid photo ID (driver's license, state identification card, passport, home country ID)  ? Proof of [facility] [place] residency (e.g. driver's license, lease/landlord information, pay stubs with address, utility bill, bank statement, etc.)  ? Proof of income (1040, last year's tax return, W2, 4 current pay stubs, other income proof)  ? Proof of assets (current bank statement + 3 most recent, disability  paperwork, life insurance info, tax value on autos, etc.)  3) Contact Your Local Health Department  Not all health departments have doctors that can see patients for sick visits, but many do, so it is worth a call to see if yours does. If you don't know where your local health department is, you can check in your phone book. The CDC also has a tool to help you locate your state's health department, and many state websites also have listings of all of their local health departments.  4) Find a Walk-in Clinic  If your illness is not likely to be very severe or complicated, you may want to try a walk in clinic. These are popping up all over the country in pharmacies, drugstores, and shopping centers. They're usually staffed by nurse practitioners or physician assistants that have been trained to treat common illnesses and complaints. They're usually fairly quick and inexpensive. However, if you have serious medical issues or chronic medical problems, these are probably not your best option   STD Testing:           [facility] [place] [place] [facility]           [address], [place], phone [phone] or [phone]           Monday - Friday, call for an appointment          [facility] [place] [place] [facility]           [address], [place], phone [phone] or [phone]           Monday - Friday, call for an appointment Abuse/Neglect:           [facility] Child Abuse Hotline: 440-127-2782501-721-8288           [facility] Abuse Hotline: 402-352-3908(502) [phone] (After Hours)  Emergency Shelter:  [facility] [phone]) [phone]  [facility] [place]- [phone]  [facility] [place] - [phone]  [facility] - [phone](336) [phone] (ages 7211-17)  Homeless Day Shelter @ [facility] - [phone](336) [phone]   Mammograms - Free at [facility] [place] [phone]- [phone]  Maternity Homes:           Room  at the [place] [facility]: [phone](336) [phone]   (Homeless mother with children)          [facility]: [phone] (Mothers only) . [facility]: [phone])[phone](336) [phone] (Pregnant 5216-22 years old) . Adopt a Mom -[phone]) [phone]  [facility]  . [facility] - Lanae Boastlara F. Gunn . [address], [address] [place] [phone]) [phone]          [facility] [place]           [address], [place]           [phone]          [employer]  [address], [place]           [phone]          [facility]           [address], [place]           [phone]          [facility] Mental Health           [phone]          [facility]           [phone]          [facility] in [place]           [address]           [phone], [facility] Child Abuse Hotline           856-317-3465           [phone] (After Hours)  Behavioral Health Services /Substance Abuse Resources:           Alcohol and Drug Services: [phone]           [facility]: [phone]          [facility]: 4257990394  . Narcotics Helpline [phone]          [facility]: 213-705-3902           Residential & Outpatient Substance Abuse Program - [facility]: 6608527512 . [facility] - [phone] Psychological Services:          [facility]: [phone]  . Therapeutic Alternatives: [phone]) [phone]          [facility] Mental Health           [address] [place]           ACCESS LINE: 7084716851     (24 Hour) . Mobile Crisis:  . HELPLINES:  [facility] [employer] on Mental Illness - [place] 726-116-1646 [facility] on Mental Illness - [place] [phone] . Walk In [facility] - [address] - [place]  [phone]       [facility] - [phone] or [phone]  [facility] - [phone] [address] - [employer] [phone]  [facility] [phone]. [address] [place] [phone]   Dental Assistance:  If unable to pay or uninsured, contact: [facility]. to become qualified for the adult dental clinic. Patient must be enrolled in [facility] (uninsured, 0-200% FPL, qualifying info).  Enroll in [facility] first, then see Primary Care Physician assigned to you, the PCP makes a dental referral. [facility] will receive referral and contacts patient for appointment.  Patients with Medicaid           [address] [phone]  [facility] (Children up to 20 + Pregnant Women) - [phone]  [facility] - [address] - [address] [phone]  If unable to pay, or uninsured: contact [facility] Department [phone] in [place] - (Children only + Pregnant Women), [phone] in [facility]- Children only) to become qualified for the adult dental clinic  Must see if eligible to enroll in [facility]  Health Insurance Marketplace before enrolling into the [facility] (exemption required) [phone] for an appointment)  [email or web address];   8130746022.  If not eligible for ACA, then go by Department of Health and Human Services to see if eligible for "orange card."  [address] [place] and [phone]  Once you get an orange card, you will have a Primary Care home who will then refer you to dental if needed.        Other IT consultant:   [facility] [phone] (ext [phone])   [address]  Dr. Lawrence Marseilles - [phone]   [address]    [place] - [phone]   [facility]           [address] [place], [place], [place], [address]           [phone], Ext. [phone]             2nd and 4th Thursday of the month at 6:30am (Simple extractions only - no wisdom teeth or surgery) First come/First serve -First 10 clients served           [facility] [place], [place] and [place] residents only)          [address] [place], [place], [address]           [phone]                    [facility] Health Department           [phone]          [facility] Health Department          [phone]         [facility] Health Department - [facility]          [phone]   Transportation Options:  Ambulance - 911 - [phone] per ride Family Member to accompany patient (if stable) [place] [employer] - [phone]  PART - [phone]  Taxi - [phone] - [place]  SCAT - [phone] (Application required)  [facility] - [phone]

## 2016-03-13 NOTE — Progress Notes (Signed)
   PRENATAL VISIT NOTE  Subjective:  Kristina Reyes is a 21 y.o. G1P0000 at 6121w4d being seen today for ongoing prenatal care.  She is currently monitored for the following issues for this low-risk pregnancy and has Supervision of normal first pregnancy, antepartum; GBS bacteriuria; History of marijuana use; Echogenic focus of heart of fetus affecting antepartum care of mother, fetus 1; and Pyelectasis of fetus on prenatal ultrasound on her problem list.  Patient reports worsening asthma symptoms, no Rx for inhaler currently.  Contractions: Not present. Vag. Bleeding: None.  Movement: Present. Denies leaking of fluid.   The following portions of the patient's history were reviewed and updated as appropriate: allergies, current medications, past family history, past medical history, past social history, past surgical history and problem list. Problem list updated.  Objective:   Vitals:   03/13/16 0930  BP: 106/74  Pulse: 82  Weight: 138 lb 11.2 oz (62.9 kg)    Fetal Status: Fetal Heart Rate (bpm): 150 Fundal Height: 29 cm Movement: Present     General:  Alert, oriented and cooperative. Patient is in no acute distress.  Skin: Skin is warm and dry. No rash noted.   Cardiovascular: Normal heart rate noted  Respiratory: Normal respiratory effort, no problems with respiration noted  Abdomen: Soft, gravid, appropriate for gestational age. Pain/Pressure: Absent     Pelvic:  Cervical exam deferred        Extremities: Normal range of motion.  Edema: None  Mental Status: Normal mood and affect. Normal behavior. Normal judgment and thought content.   Assessment and Plan:  Pregnancy: G1P0000 at 7121w4d  1. Supervision of other normal pregnancy, antepartum --28 week labs today - CBC - RPR - HIV antibody (with reflex) - Glucose Tolerance, 2 Hours w/1 Hour  2. Asthma affecting pregnancy, antepartum --Mild intermittent asthma based on symptoms with occasional exacerbations.  Will continue to monitor  if pt needs further management or maintenance medications.  Encouraged pt to f/u with primary care for asthma management outside of pregnancy. - albuterol (PROVENTIL HFA;VENTOLIN HFA) 108 (90 Base) MCG/ACT inhaler; Inhale 2 puffs into the lungs every 6 (six) hours as needed for wheezing or shortness of breath.  Dispense: 1 Inhaler; Refill: 5  3. Pyelectasis of fetus on prenatal ultrasound --Resolved on 2/14  4. Echogenic focus of heart of fetus affecting antepartum care of mother, fetus 1 --Resolved on 2/14  5. Urinary tract infection in mother during second trimester of pregnancy --Treated on 2/5.  Asymptomatic today.  F/U culture recommended at previous visit and requested by pt today. - Culture, OB Urine  Preterm labor symptoms and general obstetric precautions including but not limited to vaginal bleeding, contractions, leaking of fluid and fetal movement were reviewed in detail with the patient. Please refer to After Visit Summary for other counseling recommendations.  Return in about 2 weeks (around 03/27/2016).   Hurshel PartyLisa A Leftwich-Kirby, CNM

## 2016-03-13 NOTE — Progress Notes (Signed)
2hr gtt today. Pt does not want tdap today, will let us know when she is ready for it.

## 2016-03-14 LAB — CBC
Hematocrit: 33.8 % — ABNORMAL LOW (ref 34.0–46.6)
Hemoglobin: 11.6 g/dL (ref 11.1–15.9)
MCH: 31.4 pg (ref 26.6–33.0)
MCHC: 34.3 g/dL (ref 31.5–35.7)
MCV: 91 fL (ref 79–97)
Platelets: 197 10*3/uL (ref 150–379)
RBC: 3.7 x10E6/uL — ABNORMAL LOW (ref 3.77–5.28)
RDW: 13.1 % (ref 12.3–15.4)
WBC: 9.2 10*3/uL (ref 3.4–10.8)

## 2016-03-14 LAB — RPR: RPR: NONREACTIVE

## 2016-03-14 LAB — GLUCOSE TOLERANCE, 2 HOURS W/ 1HR
GLUCOSE, FASTING: 79 mg/dL (ref 65–91)
Glucose, 1 hour: 137 mg/dL (ref 65–179)
Glucose, 2 hour: 92 mg/dL (ref 65–152)

## 2016-03-14 LAB — HIV ANTIBODY (ROUTINE TESTING W REFLEX): HIV Screen 4th Generation wRfx: NONREACTIVE

## 2016-03-15 LAB — CULTURE, OB URINE

## 2016-03-15 LAB — URINE CULTURE, OB REFLEX: Organism ID, Bacteria: NO GROWTH

## 2016-03-19 ENCOUNTER — Encounter: Payer: Medicaid Other | Admitting: Advanced Practice Midwife

## 2016-03-27 ENCOUNTER — Ambulatory Visit (INDEPENDENT_AMBULATORY_CARE_PROVIDER_SITE_OTHER): Payer: Medicaid Other | Admitting: Advanced Practice Midwife

## 2016-03-27 VITALS — BP 105/67 | HR 84 | Wt 139.1 lb

## 2016-03-27 DIAGNOSIS — O2343 Unspecified infection of urinary tract in pregnancy, third trimester: Secondary | ICD-10-CM

## 2016-03-27 DIAGNOSIS — O2342 Unspecified infection of urinary tract in pregnancy, second trimester: Secondary | ICD-10-CM

## 2016-03-27 DIAGNOSIS — Z3400 Encounter for supervision of normal first pregnancy, unspecified trimester: Secondary | ICD-10-CM

## 2016-03-27 DIAGNOSIS — Z3403 Encounter for supervision of normal first pregnancy, third trimester: Secondary | ICD-10-CM

## 2016-03-27 NOTE — Patient Instructions (Signed)
Third Trimester of Pregnancy The third trimester is from week 28 through week 40 (months 7 through 9). The third trimester is a time when the unborn baby (fetus) is growing rapidly. At the end of the ninth month, the fetus is about 20 inches in length and weighs 6-10 pounds. Body changes during your third trimester Your body will continue to go through many changes during pregnancy. The changes vary from woman to woman. During the third trimester:  Your weight will continue to increase. You can expect to gain 25-35 pounds (11-16 kg) by the end of the pregnancy.  You may begin to get stretch marks on your hips, abdomen, and breasts.  You may urinate more often because the fetus is moving lower into your pelvis and pressing on your bladder.  You may develop or continue to have heartburn. This is caused by increased hormones that slow down muscles in the digestive tract.  You may develop or continue to have constipation because increased hormones slow digestion and cause the muscles that push waste through your intestines to relax.  You may develop hemorrhoids. These are swollen veins (varicose veins) in the rectum that can itch or be painful.  You may develop swollen, bulging veins (varicose veins) in your legs.  You may have increased body aches in the pelvis, back, or thighs. This is due to weight gain and increased hormones that are relaxing your joints.  You may have changes in your hair. These can include thickening of your hair, rapid growth, and changes in texture. Some women also have hair loss during or after pregnancy, or hair that feels dry or thin. Your hair will most likely return to normal after your baby is born.  Your breasts will continue to grow and they will continue to become tender. A yellow fluid (colostrum) may leak from your breasts. This is the first milk you are producing for your baby.  Your belly button may stick out.  You may notice more swelling in your hands,  face, or ankles.  You may have increased tingling or numbness in your hands, arms, and legs. The skin on your belly may also feel numb.  You may feel short of breath because of your expanding uterus.  You may have more problems sleeping. This can be caused by the size of your belly, increased need to urinate, and an increase in your body's metabolism.  You may notice the fetus "dropping," or moving lower in your abdomen (lightening).  You may have increased vaginal discharge.  You may notice your joints feel loose and you may have pain around your pelvic bone.  What to expect at prenatal visits You will have prenatal exams every 2 weeks until week 36. Then you will have weekly prenatal exams. During a routine prenatal visit:  You will be weighed to make sure you and the baby are growing normally.  Your blood pressure will be taken.  Your abdomen will be measured to track your baby's growth.  The fetal heartbeat will be listened to.  Any test results from the previous visit will be discussed.  You may have a cervical check near your due date to see if your cervix has softened or thinned (effaced).  You will be tested for Group B streptococcus. This happens between 35 and 37 weeks.  Your health care provider may ask you:  What your birth plan is.  How you are feeling.  If you are feeling the baby move.  If you have had   any abnormal symptoms, such as leaking fluid, bleeding, severe headaches, or abdominal cramping.  If you are using any tobacco products, including cigarettes, chewing tobacco, and electronic cigarettes.  If you have any questions.  Other tests or screenings that may be performed during your third trimester include:  Blood tests that check for low iron levels (anemia).  Fetal testing to check the health, activity level, and growth of the fetus. Testing is done if you have certain medical conditions or if there are problems during the  pregnancy.  Nonstress test (NST). This test checks the health of your baby to make sure there are no signs of problems, such as the baby not getting enough oxygen. During this test, a belt is placed around your belly. The baby is made to move, and its heart rate is monitored during movement.  What is false labor? False labor is a condition in which you feel small, irregular tightenings of the muscles in the womb (contractions) that usually go away with rest, changing position, or drinking water. These are called Braxton Hicks contractions. Contractions may last for hours, days, or even weeks before true labor sets in. If contractions come at regular intervals, become more frequent, increase in intensity, or become painful, you should see your health care provider. What are the signs of labor?  Abdominal cramps.  Regular contractions that start at 10 minutes apart and become stronger and more frequent with time.  Contractions that start on the top of the uterus and spread down to the lower abdomen and back.  Increased pelvic pressure and dull back pain.  A watery or bloody mucus discharge that comes from the vagina.  Leaking of amniotic fluid. This is also known as your "water breaking." It could be a slow trickle or a gush. Let your health care provider know if it has a color or strange odor. If you have any of these signs, call your health care provider right away, even if it is before your due date. Follow these instructions at home: Medicines  Follow your health care provider's instructions regarding medicine use. Specific medicines may be either safe or unsafe to take during pregnancy.  Take a prenatal vitamin that contains at least 600 micrograms (mcg) of folic acid.  If you develop constipation, try taking a stool softener if your health care provider approves. Eating and drinking  Eat a balanced diet that includes fresh fruits and vegetables, whole grains, good sources of protein  such as meat, eggs, or tofu, and low-fat dairy. Your health care provider will help you determine the amount of weight gain that is right for you.  Avoid raw meat and uncooked cheese. These carry germs that can cause birth defects in the baby.  If you have low calcium intake from food, talk to your health care provider about whether you should take a daily calcium supplement.  Eat four or five small meals rather than three large meals a day.  Limit foods that are high in fat and processed sugars, such as fried and sweet foods.  To prevent constipation: ? Drink enough fluid to keep your urine clear or pale yellow. ? Eat foods that are high in fiber, such as fresh fruits and vegetables, whole grains, and beans. Activity  Exercise only as directed by your health care provider. Most women can continue their usual exercise routine during pregnancy. Try to exercise for 30 minutes at least 5 days a week. Stop exercising if you experience uterine contractions.  Avoid heavy   lifting.  Do not exercise in extreme heat or humidity, or at high altitudes.  Wear low-heel, comfortable shoes.  Practice good posture.  You may continue to have sex unless your health care provider tells you otherwise. Relieving pain and discomfort  Take frequent breaks and rest with your legs elevated if you have leg cramps or low back pain.  Take warm sitz baths to soothe any pain or discomfort caused by hemorrhoids. Use hemorrhoid cream if your health care provider approves.  Wear a good support bra to prevent discomfort from breast tenderness.  If you develop varicose veins: ? Wear support pantyhose or compression stockings as told by your healthcare provider. ? Elevate your feet for 15 minutes, 3-4 times a day. Prenatal care  Write down your questions. Take them to your prenatal visits.  Keep all your prenatal visits as told by your health care provider. This is important. Safety  Wear your seat belt at  all times when driving.  Make a list of emergency phone numbers, including numbers for family, friends, the hospital, and police and fire departments. General instructions  Avoid cat litter boxes and soil used by cats. These carry germs that can cause birth defects in the baby. If you have a cat, ask someone to clean the litter box for you.  Do not travel far distances unless it is absolutely necessary and only with the approval of your health care provider.  Do not use hot tubs, steam rooms, or saunas.  Do not drink alcohol.  Do not use any products that contain nicotine or tobacco, such as cigarettes and e-cigarettes. If you need help quitting, ask your health care provider.  Do not use any medicinal herbs or unprescribed drugs. These chemicals affect the formation and growth of the baby.  Do not douche or use tampons or scented sanitary pads.  Do not cross your legs for long periods of time.  To prepare for the arrival of your baby: ? Take prenatal classes to understand, practice, and ask questions about labor and delivery. ? Make a trial run to the hospital. ? Visit the hospital and tour the maternity area. ? Arrange for maternity or paternity leave through employers. ? Arrange for family and friends to take care of pets while you are in the hospital. ? Purchase a rear-facing car seat and make sure you know how to install it in your car. ? Pack your hospital bag. ? Prepare the baby's nursery. Make sure to remove all pillows and stuffed animals from the baby's crib to prevent suffocation.  Visit your dentist if you have not gone during your pregnancy. Use a soft toothbrush to brush your teeth and be gentle when you floss. Contact a health care provider if:  You are unsure if you are in labor or if your water has broken.  You become dizzy.  You have mild pelvic cramps, pelvic pressure, or nagging pain in your abdominal area.  You have lower back pain.  You have persistent  nausea, vomiting, or diarrhea.  You have an unusual or bad smelling vaginal discharge.  You have pain when you urinate. Get help right away if:  Your water breaks before 37 weeks.  You have regular contractions less than 5 minutes apart before 37 weeks.  You have a fever.  You are leaking fluid from your vagina.  You have spotting or bleeding from your vagina.  You have severe abdominal pain or cramping.  You have rapid weight loss or weight gain.    You have shortness of breath with chest pain.  You notice sudden or extreme swelling of your face, hands, ankles, feet, or legs.  Your baby makes fewer than 10 movements in 2 hours.  You have severe headaches that do not go away when you take medicine.  You have vision changes. Summary  The third trimester is from week 28 through week 40, months 7 through 9. The third trimester is a time when the unborn baby (fetus) is growing rapidly.  During the third trimester, your discomfort may increase as you and your baby continue to gain weight. You may have abdominal, leg, and back pain, sleeping problems, and an increased need to urinate.  During the third trimester your breasts will keep growing and they will continue to become tender. A yellow fluid (colostrum) may leak from your breasts. This is the first milk you are producing for your baby.  False labor is a condition in which you feel small, irregular tightenings of the muscles in the womb (contractions) that eventually go away. These are called Braxton Hicks contractions. Contractions may last for hours, days, or even weeks before true labor sets in.  Signs of labor can include: abdominal cramps; regular contractions that start at 10 minutes apart and become stronger and more frequent with time; watery or bloody mucus discharge that comes from the vagina; increased pelvic pressure and dull back pain; and leaking of amniotic fluid. This information is not intended to replace advice  given to you by your health care provider. Make sure you discuss any questions you have with your health care provider. Document Released: 01/01/2001 Document Revised: 06/15/2015 Document Reviewed: 03/10/2012 Elsevier Interactive Patient Education  2017 [employer]  

## 2016-03-30 NOTE — Progress Notes (Signed)
   PRENATAL VISIT NOTE  Subjective:  Kristina Reyes is a 21 y.o. G1P0000 at 6127w0d being seen today for ongoing prenatal care.  She is currently monitored for the following issues for this low-risk pregnancy and has Supervision of normal first pregnancy, antepartum; GBS bacteriuria; History of marijuana use; Echogenic focus of heart of fetus affecting antepartum care of mother, fetus 1; and Pyelectasis of fetus on prenatal ultrasound on her problem list.  Patient reports no complaints.  Contractions: Not present. Vag. Bleeding: None.  Movement: Present. Denies leaking of fluid.   The following portions of the patient's history were reviewed and updated as appropriate: allergies, current medications, past family history, past medical history, past social history, past surgical history and problem list. Problem list updated.  Objective:   Vitals:   03/27/16 1021  BP: 105/67  Pulse: 84  Weight: 139 lb 1.6 oz (63.1 kg)    Fetal Status: Fetal Heart Rate (bpm): 148   Movement: Present     General:  Alert, oriented and cooperative. Patient is in no acute distress.  Skin: Skin is warm and dry. No rash noted.   Cardiovascular: Normal heart rate noted  Respiratory: Normal respiratory effort, no problems with respiration noted  Abdomen: Soft, gravid, appropriate for gestational age. Pain/Pressure: Absent     Pelvic:  Cervical exam deferred        Extremities: Normal range of motion.  Edema: None  Mental Status: Normal mood and affect. Normal behavior. Normal judgment and thought content.   Assessment and Plan:  Pregnancy: G1P0000 at 5027w0d  1. Supervision of normal first pregnancy, antepartum --Anticipatory guidance about next visits, next weeks of pregnancy given  2. Urinary tract infection in mother during second trimester of pregnancy --Treated 2 visits prior.  Urine culture negative from previous visit.  Pt asymptomatic.   Preterm labor symptoms and general obstetric precautions including  but not limited to vaginal bleeding, contractions, leaking of fluid and fetal movement were reviewed in detail with the patient. Please refer to After Visit Summary for other counseling recommendations.  Return in about 2 weeks (around 04/10/2016).   Hurshel PartyLisa A Leftwich-Kirby, CNM

## 2016-04-10 ENCOUNTER — Ambulatory Visit (INDEPENDENT_AMBULATORY_CARE_PROVIDER_SITE_OTHER): Payer: Medicaid Other | Admitting: Obstetrics and Gynecology

## 2016-04-10 VITALS — BP 110/73 | HR 75 | Wt 140.5 lb

## 2016-04-10 DIAGNOSIS — Z3400 Encounter for supervision of normal first pregnancy, unspecified trimester: Secondary | ICD-10-CM

## 2016-04-10 DIAGNOSIS — O358XX0 Maternal care for other (suspected) fetal abnormality and damage, not applicable or unspecified: Secondary | ICD-10-CM

## 2016-04-10 DIAGNOSIS — R8271 Bacteriuria: Secondary | ICD-10-CM

## 2016-04-10 DIAGNOSIS — Z3403 Encounter for supervision of normal first pregnancy, third trimester: Secondary | ICD-10-CM

## 2016-04-10 DIAGNOSIS — O35EXX0 Maternal care for other (suspected) fetal abnormality and damage, fetal genitourinary anomalies, not applicable or unspecified: Secondary | ICD-10-CM

## 2016-04-10 DIAGNOSIS — O283 Abnormal ultrasonic finding on antenatal screening of mother: Secondary | ICD-10-CM

## 2016-04-10 NOTE — Progress Notes (Signed)
   PRENATAL VISIT NOTE  Subjective:  Kristina Reyes is a 21 y.o. G1P0000 at 3547w4d being seen today for ongoing prenatal care.  She is currently monitored for the following issues for this low-risk pregnancy and has Supervision of normal first pregnancy, antepartum; GBS bacteriuria; History of marijuana use; Echogenic focus of heart of fetus affecting antepartum care of mother, fetus 1; and Pyelectasis of fetus on prenatal ultrasound on her problem list.  Patient reports no complaints.  Contractions: Not present. Vag. Bleeding: None.  Movement: Present. Denies leaking of fluid. Concerned about weight gain.   The following portions of the patient's history were reviewed and updated as appropriate: allergies, current medications, past family history, past medical history, past social history, past surgical history and problem list. Problem list updated.  Objective:   Vitals:   04/10/16 1139  BP: 110/73  Pulse: 75  Weight: 140 lb 8 oz (63.7 kg)    Fetal Status: Fetal Heart Rate (bpm): 140 Fundal Height: 33 cm Movement: Present    General:  Alert, oriented and cooperative. Patient is in no acute distress.  Skin: Skin is warm and dry. No rash noted.   Cardiovascular: Normal heart rate noted  Respiratory: Normal respiratory effort, no problems with respiration noted  Abdomen: Soft, gravid, appropriate for gestational age. Pain/Pressure: Absent     Pelvic:  Cervical exam deferred   Extremities: Normal range of motion.  Edema: None  Mental Status: Normal mood and affect. Normal behavior. Normal judgment and thought content.   Assessment and Plan:  Pregnancy: G1P0000 at 1847w4d  1. Supervision of normal first pregnancy, antepartum  Patient declined Tdap   2. GBS bacteriuria   3. Pyelectasis of fetus on prenatal ultrasound  Resolved    Preterm labor symptoms and general obstetric precautions including but not limited to vaginal bleeding, contractions, leaking of fluid and fetal movement  were reviewed in detail with the patient. Please refer to After Visit Summary for other counseling recommendations.  Return in about 2 weeks (around 04/24/2016).   Duane LopeJennifer I Kylia Grajales, NP

## 2016-04-10 NOTE — Patient Instructions (Signed)

## 2016-04-10 NOTE — Progress Notes (Signed)
Declines tdap. Declines to fill out phq9 - states I just don't want to.

## 2016-04-17 ENCOUNTER — Ambulatory Visit (INDEPENDENT_AMBULATORY_CARE_PROVIDER_SITE_OTHER): Payer: Medicaid Other | Admitting: Obstetrics and Gynecology

## 2016-04-17 ENCOUNTER — Ambulatory Visit (HOSPITAL_COMMUNITY)
Admission: RE | Admit: 2016-04-17 | Discharge: 2016-04-17 | Disposition: A | Discharge status: Home / Self Care | Payer: Medicaid Other | Source: Ambulatory Visit | Attending: Obstetrics and Gynecology | Admitting: Obstetrics and Gynecology

## 2016-04-17 VITALS — BP 104/66 | HR 88 | Wt 141.0 lb

## 2016-04-17 DIAGNOSIS — O358XX0 Maternal care for other (suspected) fetal abnormality and damage, not applicable or unspecified: Secondary | ICD-10-CM | POA: Diagnosis present

## 2016-04-17 DIAGNOSIS — O26843 Uterine size-date discrepancy, third trimester: Secondary | ICD-10-CM | POA: Diagnosis not present

## 2016-04-17 DIAGNOSIS — R8271 Bacteriuria: Secondary | ICD-10-CM

## 2016-04-17 DIAGNOSIS — Z3A34 34 weeks gestation of pregnancy: Secondary | ICD-10-CM | POA: Diagnosis not present

## 2016-04-17 DIAGNOSIS — O283 Abnormal ultrasonic finding on antenatal screening of mother: Secondary | ICD-10-CM | POA: Insufficient documentation

## 2016-04-17 DIAGNOSIS — Z3400 Encounter for supervision of normal first pregnancy, unspecified trimester: Secondary | ICD-10-CM

## 2016-04-17 NOTE — Progress Notes (Signed)
31

## 2016-04-17 NOTE — Addendum Note (Signed)
Addended by: Arne ClevelandHUTCHINSON, Edson Deridder J on: 04/17/2016 05:37 PM   Modules accepted: Orders

## 2016-04-17 NOTE — Progress Notes (Signed)
Prenatal Visit Note Date: 04/17/2016 Clinic: [facility]  Subjective:  Isabell JarvisJada Reyes is a 21 y.o. G1P0000 at [redacted]w[redacted]d being seen today for ongoing prenatal care.  She is currently monitored for the following issues for this low-risk pregnancy and has Supervision of normal first pregnancy, antepartum; GBS bacteriuria; and History of marijuana use on her problem list.  Patient reports no complaints.   Contractions: Not present. Vag. Bleeding: None.  Movement: Present. Denies leaking of fluid.   The following portions of the patient's history were reviewed and updated as appropriate: allergies, current medications, past family history, past medical history, past social history, past surgical history and problem list. Problem list updated.  Objective:   Vitals:   04/17/16 1131  BP: 104/66  Pulse: 88  Weight: 141 lb (64 kg)    Fetal Status: Fetal Heart Rate (bpm): 158 Fundal Height: 31 cm Movement: Present  Presentation: Vertex  General:  Alert, oriented and cooperative. Patient is in no acute distress.  Skin: Skin is warm and dry. No rash noted.   Cardiovascular: Normal heart rate noted  Respiratory: Normal respiratory effort, no problems with respiration noted  Abdomen: Soft, gravid, appropriate for gestational age. Pain/Pressure: Present     Pelvic:  Cervical exam deferred        Extremities: Normal range of motion.  Edema: None  Mental Status: Normal mood and affect. Normal behavior. Normal judgment and thought content.   Urinalysis:      Assessment and Plan:  Pregnancy: G1P0000 at [redacted]w[redacted]d  1. Fundal height low for dates in third trimester FKC precautions given. D/w her likely due to her being on the smaller size but will get NST not reactive (145 baseline, borderline accels, no decels, min variability initially and then VAS and food/drink given and moderate variability, toco quiet x 22m). Given this, will set up for growth and bpp today. Pt didn't eat lunch and told  to eat lunch prior to u/s visit this afternoon.   2. Supervision of normal first pregnancy, antepartum Routine care. GC/CT nv  3. GBS bacteriuria toc neg  Preterm labor symptoms and general obstetric precautions including but not limited to vaginal bleeding, contractions, leaking of fluid and fetal movement were reviewed in detail with the patient. Please refer to After Visit Summary for other counseling recommendations.  Return in about 10 days (around 04/27/2016) for 10d rob.   [place] Bingharlie Suprina [place], MD

## 2016-04-24 ENCOUNTER — Other Ambulatory Visit (HOSPITAL_COMMUNITY)
Admission: RE | Admit: 2016-04-24 | Discharge: 2016-04-24 | Disposition: A | Discharge status: Home / Self Care | Payer: Medicaid Other | Source: Ambulatory Visit | Attending: Obstetrics & Gynecology | Admitting: Obstetrics & Gynecology

## 2016-04-24 ENCOUNTER — Ambulatory Visit (HOSPITAL_COMMUNITY): Admission: RE | Admit: 2016-04-24 | Payer: Medicaid Other | Source: Ambulatory Visit

## 2016-04-24 ENCOUNTER — Ambulatory Visit (INDEPENDENT_AMBULATORY_CARE_PROVIDER_SITE_OTHER): Payer: Medicaid Other | Admitting: Obstetrics & Gynecology

## 2016-04-24 ENCOUNTER — Encounter: Payer: Medicaid Other | Admitting: Obstetrics and Gynecology

## 2016-04-24 ENCOUNTER — Encounter (HOSPITAL_COMMUNITY): Payer: Self-pay

## 2016-04-24 ENCOUNTER — Ambulatory Visit (HOSPITAL_COMMUNITY)
Admission: RE | Admit: 2016-04-24 | Discharge: 2016-04-24 | Disposition: A | Discharge status: Home / Self Care | Payer: Medicaid Other | Source: Ambulatory Visit | Attending: Obstetrics and Gynecology | Admitting: Obstetrics and Gynecology

## 2016-04-24 VITALS — BP 103/68 | HR 112 | Wt 143.0 lb

## 2016-04-24 DIAGNOSIS — Z113 Encounter for screening for infections with a predominantly sexual mode of transmission: Secondary | ICD-10-CM | POA: Diagnosis not present

## 2016-04-24 DIAGNOSIS — R8271 Bacteriuria: Secondary | ICD-10-CM

## 2016-04-24 DIAGNOSIS — Z3403 Encounter for supervision of normal first pregnancy, third trimester: Secondary | ICD-10-CM

## 2016-04-24 DIAGNOSIS — O26843 Uterine size-date discrepancy, third trimester: Secondary | ICD-10-CM | POA: Insufficient documentation

## 2016-04-24 DIAGNOSIS — O359XX0 Maternal care for (suspected) fetal abnormality and damage, unspecified, not applicable or unspecified: Secondary | ICD-10-CM | POA: Insufficient documentation

## 2016-04-24 DIAGNOSIS — Z3400 Encounter for supervision of normal first pregnancy, unspecified trimester: Secondary | ICD-10-CM

## 2016-04-24 DIAGNOSIS — Z3A35 35 weeks gestation of pregnancy: Secondary | ICD-10-CM | POA: Diagnosis not present

## 2016-04-24 NOTE — Progress Notes (Signed)
   PRENATAL VISIT NOTE  Subjective:  Kristina Reyes is a 21 y.o. G1P0000 at [redacted]w[redacted]d being seen today for ongoing prenatal care.  She is currently monitored for the following issues for this low-risk pregnancy and has Supervision of normal first pregnancy, antepartum; GBS bacteriuria; History of marijuana use; and Fundal height low for dates in third trimester on her problem list.  Patient reports no complaints.   .  .   . Denies leaking of fluid.   The following portions of the patient's history were reviewed and updated as appropriate: allergies, current medications, past family history, past medical history, past social history, past surgical history and problem list. Problem list updated.  Objective:  There were no vitals filed for this visit.  Fetal Status:           General:  Alert, oriented and cooperative. Patient is in no acute distress.  Skin: Skin is warm and dry. No rash noted.   Cardiovascular: Normal heart rate noted  Respiratory: Normal respiratory effort, no problems with respiration noted  Abdomen: Soft, gravid, appropriate for gestational age.       Pelvic:  Cervical exam deferred        Extremities: Normal range of motion.     Mental Status: Normal mood and affect. Normal behavior. Normal judgment and thought content.   Assessment and Plan:  Pregnancy: G1P0000 at [redacted]w[redacted]d  1. Supervision of normal first pregnancy, antepartum - U/S today for size less than dates  2. GBS bacteriuria - Treat in labor  Preterm labor symptoms and general obstetric precautions including but not limited to vaginal bleeding, contractions, leaking of fluid and fetal movement were reviewed in detail with the patient. Please refer to After Visit Summary for other counseling recommendations.  No Follow-up on file.   Allie Bossier, MD

## 2016-04-25 LAB — URINE CYTOLOGY ANCILLARY ONLY
Chlamydia: NEGATIVE
Neisseria Gonorrhea: NEGATIVE

## 2016-05-01 ENCOUNTER — Ambulatory Visit (INDEPENDENT_AMBULATORY_CARE_PROVIDER_SITE_OTHER): Payer: Medicaid Other | Admitting: Obstetrics & Gynecology

## 2016-05-01 ENCOUNTER — Ambulatory Visit (HOSPITAL_COMMUNITY): Payer: Medicaid Other | Attending: Obstetrics & Gynecology

## 2016-05-01 VITALS — BP 103/72 | HR 91 | Wt 143.0 lb

## 2016-05-01 DIAGNOSIS — Z3400 Encounter for supervision of normal first pregnancy, unspecified trimester: Secondary | ICD-10-CM

## 2016-05-01 DIAGNOSIS — O368130 Decreased fetal movements, third trimester, not applicable or unspecified: Secondary | ICD-10-CM

## 2016-05-01 DIAGNOSIS — Z3403 Encounter for supervision of normal first pregnancy, third trimester: Secondary | ICD-10-CM

## 2016-05-01 NOTE — Patient Instructions (Signed)
Fetal Movement Counts  Patient Name: ________________________________________________ Patient Due Date: ____________________  What is a fetal movement count?  A fetal movement count is the number of times that you feel your baby move during a certain amount of time. This may also be called a fetal kick count. A fetal movement count is recommended for every pregnant woman. You may be asked to start counting fetal movements as early as week 28 of your pregnancy.  Pay attention to when your baby is most active. You may notice your baby's sleep and wake cycles. You may also notice things that make your baby move more. You should do a fetal movement count:  · When your baby is normally most active.  · At the same time each day.    A good time to count movements is while you are resting, after having something to eat and drink.  How do I count fetal movements?  1. Find a quiet, comfortable area. Sit, or lie down on your side.  2. Write down the date, the start time and stop time, and the number of movements that you felt between those two times. Take this information with you to your health care visits.  3. For 2 hours, count kicks, flutters, swishes, rolls, and jabs. You should feel at least 10 movements during 2 hours.  4. You may stop counting after you have felt 10 movements.  5. If you do not feel 10 movements in 2 hours, have something to eat and drink. Then, keep resting and counting for 1 hour. If you feel at least 4 movements during that hour, you may stop counting.  Contact a health care provider if:  · You feel fewer than 4 movements in 2 hours.  · Your baby is not moving like he or she usually does.  Date: ____________ Start time: ____________ Stop time: ____________ Movements: ____________  Date: ____________ Start time: ____________ Stop time: ____________ Movements: ____________  Date: ____________ Start time: ____________ Stop time: ____________ Movements: ____________  Date: ____________ Start time:  ____________ Stop time: ____________ Movements: ____________  Date: ____________ Start time: ____________ Stop time: ____________ Movements: ____________  Date: ____________ Start time: ____________ Stop time: ____________ Movements: ____________  Date: ____________ Start time: ____________ Stop time: ____________ Movements: ____________  Date: ____________ Start time: ____________ Stop time: ____________ Movements: ____________  Date: ____________ Start time: ____________ Stop time: ____________ Movements: ____________  This information is not intended to replace advice given to you by your health care provider. Make sure you discuss any questions you have with your health care provider.  Document Released: 02/06/2006 Document Revised: 09/06/2015 Document Reviewed: 02/16/2015  Elsevier Interactive Patient Education © 2017 [employer]

## 2016-05-01 NOTE — Progress Notes (Signed)
   PRENATAL VISIT NOTE  Subjective:  Kristina Reyes is a 21 y.o. G1P0000 at [redacted]w[redacted]d being seen today for ongoing prenatal care.  She is currently monitored for the following issues for this low-risk pregnancy and has Supervision of normal first pregnancy, antepartum; GBS bacteriuria; History of marijuana use; Fundal height low for dates in third trimester; and Decreased fetal movement determined by examination, third trimester on her problem list.  Patient reports continued decreased fetal movement. Only feels baby move q 2-3 hours. She is worried. This has been persistent for last two weeks, had NRNST then BPP 8/10 on 04/17/16. Normal growth scan on 04/25/16.    Contractions: Irregular. Vag. Bleeding: None.  Movement: (!) Decreased. Denies leaking of fluid.   The following portions of the patient's history were reviewed and updated as appropriate: allergies, current medications, past family history, past medical history, past social history, past surgical history and problem list. Problem list updated.  Objective:   Vitals:   05/01/16 1026  BP: 103/72  Pulse: 91  Weight: 143 lb (64.9 kg)    Fetal Status: Fetal Heart Rate (bpm): 148 Fundal Height: 26 cm Movement: (!) Decreased     General:  Alert, oriented and cooperative. Patient is in no acute distress.  Skin: Skin is warm and dry. No rash noted.   Cardiovascular: Normal heart rate noted  Respiratory: Normal respiratory effort, no problems with respiration noted  Abdomen: Soft, gravid, appropriate for gestational age. Pain/Pressure: Present     Pelvic:  Cervical exam deferred        Extremities: Normal range of motion.  Edema: None  Mental Status: Normal mood and affect. Normal behavior. Normal judgment and thought content.   NST performed today was reviewed and was found to be reactive. Only had two 15x 15 accelerations noted, had other 10 x 10 accelerations.     Assessment and Plan:  Pregnancy: G1P0000 at [redacted]w[redacted]d  1. Decreased fetal  movement determined by examination, third trimester Will start 2x/week testing given persistent decreased fetal movement. Instructed on kick counts/fetal movement surveillance. Reactive NST today, will get follow up BPP today for further reassurance and start 2x/week NST +AFI next week.  - [place] MFM FETAL BPP WO NON STRESS; Future  2. Supervision of normal first pregnancy, antepartum Preterm labor symptoms and general obstetric precautions including but not limited to vaginal bleeding, contractions, leaking of fluid and fetal movement were reviewed in detail with the patient. Please refer to After Visit Summary for other counseling recommendations.  Return in about 5 days (around 05/06/2016) for NST only.  1 week: OB visit, NST and AFI.   Tereso Newcomer, MD

## 2016-05-02 ENCOUNTER — Telehealth: Payer: Self-pay | Admitting: *Deleted

## 2016-05-02 NOTE — Telephone Encounter (Signed)
Received a call from MFM that patient no showed her appt yesterday for the BPP.  States pt is now calling to reschedule and they have no appointments available for today or tomorrow.  Spoke to Dr Vergie Living about the case, informed me that pt could come in the office for another NST if decreased movement persist.  Spoke to pt and informed her of plan, declined NST at this time.  Informed pt to go to MAU if decreased fetal movement persist for evaluation. Pt acknowledged instructions.

## 2016-05-06 ENCOUNTER — Ambulatory Visit (INDEPENDENT_AMBULATORY_CARE_PROVIDER_SITE_OTHER): Payer: Medicaid Other | Admitting: *Deleted

## 2016-05-06 VITALS — BP 98/62 | HR 90 | Wt 143.0 lb

## 2016-05-06 DIAGNOSIS — O368130 Decreased fetal movements, third trimester, not applicable or unspecified: Secondary | ICD-10-CM | POA: Diagnosis not present

## 2016-05-06 NOTE — Progress Notes (Signed)
NST performed today was reviewed and was found to be reactive.  Continue recommended antenatal testing and prenatal care.  No other complaints or concerns.  Labor and fetal movement precautions reviewed.   Tereso Newcomer, MD

## 2016-05-09 ENCOUNTER — Ambulatory Visit (INDEPENDENT_AMBULATORY_CARE_PROVIDER_SITE_OTHER): Payer: Medicaid Other | Admitting: Obstetrics and Gynecology

## 2016-05-09 VITALS — BP 113/62 | HR 97 | Wt 146.0 lb

## 2016-05-09 DIAGNOSIS — R8271 Bacteriuria: Secondary | ICD-10-CM

## 2016-05-09 DIAGNOSIS — O368130 Decreased fetal movements, third trimester, not applicable or unspecified: Secondary | ICD-10-CM | POA: Diagnosis not present

## 2016-05-09 DIAGNOSIS — Z3400 Encounter for supervision of normal first pregnancy, unspecified trimester: Secondary | ICD-10-CM

## 2016-05-09 DIAGNOSIS — Z3403 Encounter for supervision of normal first pregnancy, third trimester: Secondary | ICD-10-CM

## 2016-05-09 DIAGNOSIS — O26843 Uterine size-date discrepancy, third trimester: Secondary | ICD-10-CM

## 2016-05-09 NOTE — Progress Notes (Signed)
Prenatal Visit Note Date: 05/09/2016 Clinic: [facility]  Subjective:  Kristina Reyes is a 21 y.o. G1P0000 at [redacted]w[redacted]d being seen today for ongoing prenatal care.  She is currently monitored for the following issues for this low-risk pregnancy and has Supervision of normal first pregnancy, antepartum; GBS bacteriuria; History of marijuana use; Fundal height low for dates in third trimester; and Decreased fetal movement determined by examination, third trimester on her problem list.  Patient reports no complaints.   Contractions: Irregular. Vag. Bleeding: None.  Movement: Present. Denies leaking of fluid.   The following portions of the patient's history were reviewed and updated as appropriate: allergies, current medications, past family history, past medical history, past social history, past surgical history and problem list. Problem list updated.  Objective:   Vitals:   05/09/16 0920  BP: 113/62  Pulse: 97  Weight: 146 lb (66.2 kg)    Fetal Status: Fetal Heart Rate (bpm): 148 Fundal Height: 32 cm Movement: Present  Presentation: Vertex  General:  Alert, oriented and cooperative. Patient is in no acute distress.  Skin: Skin is warm and dry. No rash noted.   Cardiovascular: Normal heart rate noted  Respiratory: Normal respiratory effort, no problems with respiration noted  Abdomen: Soft, gravid, appropriate for gestational age. Pain/Pressure: Absent     Pelvic:  Cervical exam deferred        Extremities: Normal range of motion.  Edema: None  Mental Status: Normal mood and affect. Normal behavior. Normal judgment and thought content.   Urinalysis:      Assessment and Plan:  Pregnancy: G1P0000 at [redacted]w[redacted]d  1. Decreased fetal movement determined by examination in third trimester, single or unspecified fetus rNST and normal afi, cephalic today. Continue with 2x/week testing - Amniotic fluid index with NST - [place] MFM OB FOLLOW UP; Future  2. Supervision of normal first  pregnancy, antepartum Routine care  3. GBS bacteriuria tx in labor  4. Fundal height low for dates in third trimester Rpt growth u/s in 7-10d   Term labor symptoms and general obstetric precautions including but not limited to vaginal bleeding, contractions, leaking of fluid and fetal movement were reviewed in detail with the patient. Please refer to After Visit Summary for other counseling recommendations.  No Follow-up on file.   [place] Bing, MD

## 2016-05-13 ENCOUNTER — Inpatient Hospital Stay (HOSPITAL_COMMUNITY)
Admission: AD | Admit: 2016-05-13 | Discharge: 2016-05-16 | DRG: 775 | Disposition: A | Discharge status: Home / Self Care | Payer: Medicaid Other | Source: Ambulatory Visit | Attending: Obstetrics & Gynecology | Admitting: Obstetrics & Gynecology

## 2016-05-13 ENCOUNTER — Encounter (HOSPITAL_COMMUNITY): Payer: Self-pay

## 2016-05-13 DIAGNOSIS — O9952 Diseases of the respiratory system complicating childbirth: Secondary | ICD-10-CM | POA: Diagnosis present

## 2016-05-13 DIAGNOSIS — F1290 Cannabis use, unspecified, uncomplicated: Secondary | ICD-10-CM | POA: Diagnosis present

## 2016-05-13 DIAGNOSIS — O368130 Decreased fetal movements, third trimester, not applicable or unspecified: Secondary | ICD-10-CM

## 2016-05-13 DIAGNOSIS — Z3A38 38 weeks gestation of pregnancy: Secondary | ICD-10-CM | POA: Diagnosis not present

## 2016-05-13 DIAGNOSIS — J45909 Unspecified asthma, uncomplicated: Secondary | ICD-10-CM | POA: Diagnosis present

## 2016-05-13 DIAGNOSIS — O99824 Streptococcus B carrier state complicating childbirth: Secondary | ICD-10-CM | POA: Diagnosis present

## 2016-05-13 DIAGNOSIS — O99324 Drug use complicating childbirth: Secondary | ICD-10-CM | POA: Diagnosis present

## 2016-05-13 DIAGNOSIS — Z3493 Encounter for supervision of normal pregnancy, unspecified, third trimester: Secondary | ICD-10-CM | POA: Diagnosis present

## 2016-05-13 LAB — CBC
HEMATOCRIT: 36.1 % (ref 36.0–46.0)
HEMOGLOBIN: 12.8 g/dL (ref 12.0–15.0)
MCH: 32.1 pg (ref 26.0–34.0)
MCHC: 35.5 g/dL (ref 30.0–36.0)
MCV: 90.5 fL (ref 78.0–100.0)
Platelets: 186 10*3/uL (ref 150–400)
RBC: 3.99 MIL/uL (ref 3.87–5.11)
RDW: 12.6 % (ref 11.5–15.5)
WBC: 9.9 10*3/uL (ref 4.0–10.5)

## 2016-05-13 LAB — TYPE AND SCREEN
ABO/RH(D): O POS
ANTIBODY SCREEN: NEGATIVE

## 2016-05-13 LAB — ABO/RH: ABO/RH(D): O POS

## 2016-05-13 MED ORDER — PENICILLIN G POTASSIUM 5000000 UNITS IJ SOLR
5.0000 10*6.[IU] | Freq: Once | INTRAVENOUS | Status: AC
  Administered 2016-05-13: 5 10*6.[IU] via INTRAVENOUS
  Filled 2016-05-13: qty 5

## 2016-05-13 MED ORDER — OXYTOCIN BOLUS FROM INFUSION
500.0000 mL | Freq: Once | INTRAVENOUS | Status: AC
  Administered 2016-05-14: 500 mL/h via INTRAVENOUS

## 2016-05-13 MED ORDER — TERBUTALINE SULFATE 1 MG/ML IJ SOLN
0.2500 mg | Freq: Once | INTRAMUSCULAR | Status: DC | PRN
  Filled 2016-05-13: qty 1

## 2016-05-13 MED ORDER — OXYCODONE-ACETAMINOPHEN 5-325 MG PO TABS: 1.0000 | ORAL_TABLET | ORAL | Status: DC | PRN

## 2016-05-13 MED ORDER — FENTANYL CITRATE (PF) 100 MCG/2ML IJ SOLN
100.0000 ug | INTRAMUSCULAR | Status: DC | PRN
  Administered 2016-05-13 – 2016-05-14 (×5): 100 ug via INTRAVENOUS
  Filled 2016-05-13 (×5): qty 2

## 2016-05-13 MED ORDER — OXYCODONE-ACETAMINOPHEN 5-325 MG PO TABS: 2.0000 | ORAL_TABLET | ORAL | Status: DC | PRN

## 2016-05-13 MED ORDER — OXYTOCIN 40 UNITS IN LACTATED RINGERS INFUSION - SIMPLE MED
2.5000 [IU]/h | INTRAVENOUS | Status: DC
  Filled 2016-05-13: qty 1000

## 2016-05-13 MED ORDER — ONDANSETRON HCL 4 MG/2ML IJ SOLN: 4.0000 mg | Freq: Four times a day (QID) | INTRAMUSCULAR | Status: DC | PRN

## 2016-05-13 MED ORDER — LIDOCAINE HCL (PF) 1 % IJ SOLN
30.0000 mL | INTRAMUSCULAR | Status: DC | PRN
  Filled 2016-05-13: qty 30

## 2016-05-13 MED ORDER — LACTATED RINGERS IV SOLN: 500.0000 mL | INTRAVENOUS | Status: DC | PRN

## 2016-05-13 MED ORDER — OXYTOCIN 40 UNITS IN LACTATED RINGERS INFUSION - SIMPLE MED
1.0000 m[IU]/min | INTRAVENOUS | Status: DC
  Administered 2016-05-13: 2 m[IU]/min via INTRAVENOUS

## 2016-05-13 MED ORDER — PENICILLIN G POT IN DEXTROSE 60000 UNIT/ML IV SOLN
3.0000 10*6.[IU] | INTRAVENOUS | Status: DC
  Administered 2016-05-13 – 2016-05-14 (×3): 3 10*6.[IU] via INTRAVENOUS
  Filled 2016-05-13 (×5): qty 50

## 2016-05-13 MED ORDER — ACETAMINOPHEN 325 MG PO TABS: 650.0000 mg | ORAL_TABLET | ORAL | Status: DC | PRN

## 2016-05-13 MED ORDER — SOD CITRATE-CITRIC ACID 500-334 MG/5ML PO SOLN: 30.0000 mL | ORAL | Status: DC | PRN

## 2016-05-13 MED ORDER — LACTATED RINGERS IV SOLN
INTRAVENOUS | Status: DC
  Administered 2016-05-13 – 2016-05-14 (×3): via INTRAVENOUS

## 2016-05-13 NOTE — Progress Notes (Signed)
Pt states baby is moving less than normal today.

## 2016-05-13 NOTE — H&P (Signed)
LABOR AND DELIVERY ADMISSION HISTORY AND PHYSICAL NOTE  Kristina Reyes is a 21 y.o. female G1P0000 with IUP at [redacted]w[redacted]d by 12 wk [place] presenting for SOL. She reports contraction since 8 AM getting worse and associated with some vaginal bleeding at 12.   She reports positive fetal movement. She denies leakage of fluid or vaginal bleeding.  Prenatal History/Complications:  Past Medical History: Past Medical History:  Diagnosis Date  . Asthma     Past Surgical History: Past Surgical History:  Procedure Laterality Date  . arm surgery Left    BB removed from left forearm.     Obstetrical History: OB History    Gravida Para Term Preterm AB Living   1 0 0 0 0 0   SAB TAB Ectopic Multiple Live Births   0 0 0 0        Social History: Social History   Social History  . Marital status: Single    Spouse name: N/A  . Number of children: N/A  . Years of education: N/A   Social History Main Topics  . Smoking status: Never Smoker  . Smokeless tobacco: Never Used  . Alcohol use No  . Drug use: No  . Sexual activity: Not Currently    Partners: Male    Birth control/ protection: None   Other Topics Concern  . None   Social History Narrative  . None    Family History: Family History  Problem Relation Age of Onset  . Asthma Mother   . Asthma Brother     Allergies: No Known Allergies  Prescriptions Prior to Admission  Medication Sig Dispense Refill Last Dose  . albuterol (PROVENTIL HFA;VENTOLIN HFA) 108 (90 Base) MCG/ACT inhaler Inhale 2 puffs into the lungs every 6 (six) hours as needed for wheezing or shortness of breath. (Patient not taking: Reported on 05/09/2016) 1 Inhaler 5 More than a month at Unknown time  . docusate sodium (COLACE) 100 MG capsule Take 1 capsule (100 mg total) by mouth 2 (two) times daily. (Patient not taking: Reported on 05/09/2016) 30 capsule 0 Not Taking at Unknown time     Review of Systems   All systems reviewed and negative except as stated in  HPI  Blood pressure 108/66, pulse 81, temperature 98.8 F (37.1 C), temperature source Axillary, resp. rate 18, height  (1.702 m), weight 145 lb (65.8 kg), last menstrual period 08/19/2015, SpO2 99 %. General appearance: alert, cooperative and appears stated age Lungs: clear to auscultation bilaterally Heart: regular rate and rhythm Abdomen: soft, non-tender; bowel sounds normal Extremities: No calf swelling or tenderness Presentation: cephalic by nursing exam Fetal monitoring: categor 1 155/mod var/not reactive - no decels Uterine activity: contractions q3-5 minutes Dilation: 3.5 Effacement (%): 90 Station: 0 Exam by:: cwhite,rnc   Prenatal labs: ABO, Rh: --/--/O POS (04/23 1532) Antibody: PENDING (04/23 1532) Rubella: non-immune RPR: Non Reactive (02/21 0929)  HBsAg: NEGATIVE (10/26 0859)  HIV: Non Reactive (02/21 0929)  GBS: Positive (10/26 0000)  2 hr Glucola: normal Genetic screening:  normal Anatomy US: normal  Prenatal Transfer Tool  Maternal Diabetes: No Genetic Screening: Normal Maternal Ultrasounds/Referrals: Normal Fetal Ultrasounds or other Referrals:  None Maternal Substance Abuse:  No Significant Maternal Medications:  None Significant Maternal Lab Results: Lab values include: Group B Strep negative  Results for orders placed or performed during the hospital encounter of 05/13/16 (from the past 24 hour(s))  CBC   Collection Time: 05/13/16  3:32 PM  Result Value Ref Range  WBC 9.9 4.0 - 10.5 K/uL   RBC 3.99 3.87 - 5.11 MIL/uL   Hemoglobin 12.8 12.0 - 15.0 g/dL   HCT 96.0 45.4 - 09.8 %   MCV 90.5 78.0 - 100.0 fL   MCH 32.1 26.0 - 34.0 pg   MCHC 35.5 30.0 - 36.0 g/dL   RDW 11.9 14.7 - 82.9 %   Platelets 186 150 - 400 K/uL  Type and screen [facility]    Collection Time: 05/13/16  3:32 PM  Result Value Ref Range   ABO/RH(D) O POS    Antibody Screen PENDING    Sample Expiration 05/16/2016     Patient Active Problem List    Diagnosis Date Noted  . Normal labor and delivery 05/13/2016  . Decreased fetal movement determined by examination, third trimester 05/01/2016  . Fundal height low for dates in third trimester 04/17/2016  . History of marijuana use 11/20/2015  . GBS bacteriuria 11/18/2015  . Supervision of normal first pregnancy, antepartum 11/16/2015    Assessment: Kristina Reyes is a 21 y.o. G1P0000 at [redacted]w[redacted]d here for SOL.  #Labor: expectant management at this time. Monitor FHTs for reactivity.  #Pain: Plans for epidural #FWB: Category 1 #ID:  GBS positive in urine - PCN #MOF: breast #MOC: thinking about depo #Circ:  Outpatient.  Kristina Reyes 05/13/2016, 4:30 PM

## 2016-05-13 NOTE — MAU Note (Signed)
spotting and having slight contractions.  Contractions started this morning.  Just noted the spotting. No leaking . Was 1 cm when last checked

## 2016-05-13 NOTE — Anesthesia Pain Management Evaluation Note (Signed)
  CRNA Pain Management Visit Note  Patient: Kristina Reyes, 21 y.o., female  "Hello I am a member of the anesthesia team at [facility]. We have an anesthesia team available at all times to provide care throughout the hospital, including epidural management and anesthesia for C-section. I don't know your plan for the delivery whether it a natural birth, water birth, IV sedation, nitrous supplementation, doula or epidural, but we want to meet your pain goals."   1.Was your pain managed to your expectations on prior hospitalizations?   No prior hospitalizations  2.What is your expectation for pain management during this hospitalization?     IV pain meds  3.How can we help you reach that goal? IV pain  Record the patient's initial score and the patient's pain goal.   Pain: 7  Pain Goal: 8 [facility] wants you to be able to say your pain was always managed very well.  Carlyn Mullenbach 05/13/2016

## 2016-05-13 NOTE — Progress Notes (Signed)
Dr Genevie Ann on unit. Notified of repeat SVE. To watch pt for an hour and re-check.

## 2016-05-14 ENCOUNTER — Encounter (HOSPITAL_COMMUNITY): Payer: Self-pay | Admitting: Anesthesiology

## 2016-05-14 ENCOUNTER — Inpatient Hospital Stay (HOSPITAL_COMMUNITY): Payer: Medicaid Other | Admitting: Anesthesiology

## 2016-05-14 ENCOUNTER — Other Ambulatory Visit: Payer: Medicaid Other

## 2016-05-14 DIAGNOSIS — O99824 Streptococcus B carrier state complicating childbirth: Secondary | ICD-10-CM

## 2016-05-14 DIAGNOSIS — Z3A38 38 weeks gestation of pregnancy: Secondary | ICD-10-CM

## 2016-05-14 LAB — RPR: RPR Ser Ql: NONREACTIVE

## 2016-05-14 MED ORDER — SENNOSIDES-DOCUSATE SODIUM 8.6-50 MG PO TABS
2.0000 | ORAL_TABLET | ORAL | Status: DC
  Administered 2016-05-15 (×2): 2 via ORAL
  Filled 2016-05-14 (×2): qty 2

## 2016-05-14 MED ORDER — COCONUT OIL OIL: 1.0000 "application " | TOPICAL_OIL | Status: DC | PRN

## 2016-05-14 MED ORDER — PHENYLEPHRINE 40 MCG/ML (10ML) SYRINGE FOR IV PUSH (FOR BLOOD PRESSURE SUPPORT)
80.0000 ug | PREFILLED_SYRINGE | INTRAVENOUS | Status: DC | PRN
  Filled 2016-05-14: qty 5

## 2016-05-14 MED ORDER — ZOLPIDEM TARTRATE 5 MG PO TABS: 5.0000 mg | ORAL_TABLET | Freq: Every evening | ORAL | Status: DC | PRN

## 2016-05-14 MED ORDER — DIPHENHYDRAMINE HCL 50 MG/ML IJ SOLN: 12.5000 mg | INTRAMUSCULAR | Status: DC | PRN

## 2016-05-14 MED ORDER — PRENATAL MULTIVITAMIN CH
1.0000 | ORAL_TABLET | Freq: Every day | ORAL | Status: DC
  Filled 2016-05-14: qty 1

## 2016-05-14 MED ORDER — ONDANSETRON HCL 4 MG/2ML IJ SOLN: 4.0000 mg | INTRAMUSCULAR | Status: DC | PRN

## 2016-05-14 MED ORDER — PHENYLEPHRINE 40 MCG/ML (10ML) SYRINGE FOR IV PUSH (FOR BLOOD PRESSURE SUPPORT)
PREFILLED_SYRINGE | INTRAVENOUS | Status: AC
  Filled 2016-05-14: qty 10

## 2016-05-14 MED ORDER — FENTANYL 2.5 MCG/ML BUPIVACAINE 1/10 % EPIDURAL INFUSION (WH - ANES)
INTRAMUSCULAR | Status: AC
  Filled 2016-05-14: qty 100

## 2016-05-14 MED ORDER — FENTANYL 2.5 MCG/ML BUPIVACAINE 1/10 % EPIDURAL INFUSION (WH - ANES)
14.0000 mL/h | INTRAMUSCULAR | Status: DC | PRN
  Administered 2016-05-14: 14 mL/h via EPIDURAL

## 2016-05-14 MED ORDER — DIBUCAINE 1 % RE OINT: 1.0000 "application " | TOPICAL_OINTMENT | RECTAL | Status: DC | PRN

## 2016-05-14 MED ORDER — EPHEDRINE 5 MG/ML INJ
10.0000 mg | INTRAVENOUS | Status: DC | PRN
  Filled 2016-05-14: qty 2

## 2016-05-14 MED ORDER — SIMETHICONE 80 MG PO CHEW: 80.0000 mg | CHEWABLE_TABLET | ORAL | Status: DC | PRN

## 2016-05-14 MED ORDER — ONDANSETRON HCL 4 MG PO TABS
4.0000 mg | ORAL_TABLET | ORAL | Status: DC | PRN
Start: 2016-05-14 — End: 2016-05-16

## 2016-05-14 MED ORDER — TETANUS-DIPHTH-ACELL PERTUSSIS 5-2.5-18.5 LF-MCG/0.5 IM SUSP: 0.5000 mL | Freq: Once | INTRAMUSCULAR | Status: DC

## 2016-05-14 MED ORDER — DIPHENHYDRAMINE HCL 25 MG PO CAPS: 25.0000 mg | ORAL_CAPSULE | Freq: Four times a day (QID) | ORAL | Status: DC | PRN

## 2016-05-14 MED ORDER — COMPLETENATE 29-1 MG PO CHEW
1.0000 | CHEWABLE_TABLET | Freq: Every day | ORAL | Status: DC
  Administered 2016-05-14 – 2016-05-16 (×3): 1 via ORAL
  Filled 2016-05-14 (×3): qty 1

## 2016-05-14 MED ORDER — LACTATED RINGERS IV SOLN
500.0000 mL | Freq: Once | INTRAVENOUS | Status: AC
  Administered 2016-05-14: 500 mL via INTRAVENOUS

## 2016-05-14 MED ORDER — IBUPROFEN 100 MG/5ML PO SUSP
600.0000 mg | Freq: Four times a day (QID) | ORAL | Status: DC
  Administered 2016-05-14 – 2016-05-16 (×6): 600 mg via ORAL
  Filled 2016-05-14 (×9): qty 30

## 2016-05-14 MED ORDER — ACETAMINOPHEN 160 MG/5ML PO SOLN
650.0000 mg | ORAL | Status: DC | PRN
  Filled 2016-05-14: qty 20.3

## 2016-05-14 MED ORDER — IBUPROFEN 600 MG PO TABS
600.0000 mg | ORAL_TABLET | Freq: Four times a day (QID) | ORAL | Status: DC
  Filled 2016-05-14: qty 1

## 2016-05-14 MED ORDER — BENZOCAINE-MENTHOL 20-0.5 % EX AERO: 1.0000 "application " | INHALATION_SPRAY | CUTANEOUS | Status: DC | PRN

## 2016-05-14 MED ORDER — WITCH HAZEL-GLYCERIN EX PADS: 1.0000 "application " | MEDICATED_PAD | CUTANEOUS | Status: DC | PRN

## 2016-05-14 MED ORDER — ACETAMINOPHEN 325 MG PO TABS: 650.0000 mg | ORAL_TABLET | ORAL | Status: DC | PRN

## 2016-05-14 MED ORDER — LIDOCAINE HCL (PF) 1 % IJ SOLN
INTRAMUSCULAR | Status: DC | PRN
  Administered 2016-05-14: 6 mL via EPIDURAL
  Administered 2016-05-14: 7 mL via EPIDURAL

## 2016-05-14 NOTE — Anesthesia Postprocedure Evaluation (Signed)
Anesthesia Post Note  Patient: Kristina Reyes  Procedure(s) Performed: * No procedures listed *  Patient location during evaluation: Mother Baby Anesthesia Type: Epidural Level of consciousness: awake and alert and oriented Pain management: pain level controlled Vital Signs Assessment: post-procedure vital signs reviewed and stable Respiratory status: spontaneous breathing and nonlabored ventilation Cardiovascular status: stable Postop Assessment: no headache, patient able to bend at knees, no backache, no signs of nausea or vomiting, epidural receding and adequate PO intake Anesthetic complications: no        Last Vitals:  Vitals:   05/14/16 1000 05/14/16 1405  BP: 111/61 104/63  Pulse: (!) 112 98  Resp: 18 18  Temp: 37.7 C 37.8 C    Last Pain:  Vitals:   05/14/16 1200  TempSrc:   PainSc: 0-No pain   Pain Goal: Patients Stated Pain Goal: 3 (05/14/16 0900)               Donnalee Curry Hristova

## 2016-05-14 NOTE — Anesthesia Procedure Notes (Signed)
Epidural Patient location during procedure: OB Start time: 05/14/2016 2:09 AM End time: 05/14/2016 2:13 AM  Staffing Anesthesiologist: Leilani Able Performed: anesthesiologist   Preanesthetic Checklist Completed: patient identified, surgical consent, pre-op evaluation, timeout performed, IV checked, risks and benefits discussed and monitors and equipment checked  Epidural Patient position: sitting Prep: site prepped and draped and DuraPrep Patient monitoring: continuous pulse ox and blood pressure Approach: midline Injection technique: LOR air  Needle:  Needle type: Tuohy  Needle gauge: 17 G Needle length: 9 cm and 9 Needle insertion depth: 6 cm Catheter type: closed end flexible Catheter size: 19 Gauge Catheter at skin depth: 11 cm Test dose: negative and Other  Assessment Sensory level: T9 Events: blood not aspirated, injection not painful, no injection resistance, negative IV test and no paresthesia

## 2016-05-14 NOTE — Anesthesia Preprocedure Evaluation (Addendum)
Anesthesia Evaluation  Patient identified by MRN, date of birth, ID band Patient awake    Reviewed: Allergy & Precautions, H&P , NPO status , Patient's Chart, lab work & pertinent test results  Airway Mallampati: I  TM Distance: >3 FB Neck ROM: full    Dental no notable dental hx.    Pulmonary    Pulmonary exam normal breath sounds clear to auscultation       Cardiovascular negative cardio ROS Normal cardiovascular exam     Neuro/Psych negative neurological ROS  negative psych ROS   GI/Hepatic negative GI ROS, Neg liver ROS,   Endo/Other  negative endocrine ROS  Renal/GU negative Renal ROS  negative genitourinary   Musculoskeletal negative musculoskeletal ROS (+)   Abdominal Normal abdominal exam  (+)   Peds  Hematology negative hematology ROS (+)   Anesthesia Other Findings   Reproductive/Obstetrics (+) Pregnancy                             Anesthesia Physical Anesthesia Plan  ASA: II  Anesthesia Plan: Epidural   Post-op Pain Management:    Induction:   Airway Management Planned:   Additional Equipment:   Intra-op Plan:   Post-operative Plan:   Informed Consent: I have reviewed the patients History and Physical, chart, labs and discussed the procedure including the risks, benefits and alternatives for the proposed anesthesia with the patient or authorized representative who has indicated his/her understanding and acceptance.     Plan Discussed with:   Anesthesia Plan Comments:        Anesthesia Quick Evaluation

## 2016-05-14 NOTE — Lactation Note (Signed)
This note was copied from a baby's chart. Lactation Consultation Note Baby weight 5.15lbs had 2 low blood glucose, given colostrum, formula, and glucose.  Mom resting, eyes opened when came into rm. Introduced self. Asked mom if she had been doing STS d/t low sugar. Mom stated yes. Baby laying sleeping in crib.  Discussed briefly LC would like to set up DEBP d/t baby less than 6 lbs. Mom stated yes thank you. When came back with pump and kit. Mom sleeping.  Set up pump w/no flanges d/t needs to be properly fitted. Mom will need to be seen again by [facility] d/t not able to educate. Will report to RN or if unable to locate leave message.  pamplet left at bedside w/supplementing amount information sheet.  Patient Name: Kristina Reyes ENIDP'O Date: 05/14/2016 Reason for consult: Initial assessment   Maternal Data Formula Feeding for Exclusion: No Has patient been taught Hand Expression?: Yes Does the patient have breastfeeding experience prior to this delivery?: No  Feeding Feeding Type: Breast Milk Length of feed: 1 min  LATCH Score/Interventions                      Lactation Tools Discussed/Used Pump Review: Other (comment) (mom fell asleep when brought pump kit back to rm. needs educated. ) Initiated by:: Allayne Stack RN IBCLC Date initiated:: 05/14/16   Consult Status Consult Status: Follow-up Date: 05/14/16 Follow-up type: In-patient    Theodoro Kalata 05/14/2016, 1:02 PM

## 2016-05-14 NOTE — Lactation Note (Signed)
This note was copied from a baby's chart. Lactation Consultation Note  P1, Baby 8 hours old < 6 lbs. Baby has had difficulty sustaining latch.  He has disorganized suck and nipples are semi flat. Mother easily expresses colostrum. No latch achieved then applied #20NS and he sustained latch for approx 20 min. Colostrum visible in nipple shield after feeding. Assisted w/ latching baby on R side and he latches but it's shallow. Mother applied NS on R side and baby latched briefly on second breast and fell asleep. Placed baby STS on mother's chest. Encouraged mother to start pumping once baby is asleep approx 4-6 times per day for 10-15 min. Reviewed pumping, spoon feeding, cleaning. Suggest mother call if she needs assistance w/ syringe feeding or pumping.    Patient Name: Kristina Reyes Today's Date: 05/14/2016 Reason for consult: Follow-up assessment   Maternal Data Has patient been taught Hand Expression?: Yes Does the patient have breastfeeding experience prior to this delivery?: No  Feeding Feeding Type: Breast Fed Length of feed: 1 min  LATCH Score/Interventions Latch: Grasps breast easily, tongue down, lips flanged, rhythmical sucking.  Audible Swallowing: A few with stimulation  Type of Nipple: Flat (semi flat)  Comfort (Breast/Nipple): Soft / non-tender     Hold (Positioning): Assistance needed to correctly position infant at breast and maintain latch.  LATCH Score: 7  Lactation Tools Discussed/Used Tools: Nipple Shields Nipple shield size: 20 Pump Review: Other (comment) (mom fell asleep when brought pump kit back to rm. needs educated. ) Initiated by:: L. Carver RN IBCLC Date initiated:: 05/14/16   Consult Status Consult Status: Follow-up Date: 05/14/16 Follow-up type: In-patient    Kristina Reyes, Kristina Reyes 05/14/2016, 3:22 PM    

## 2016-05-15 NOTE — Progress Notes (Signed)
  CLINICAL SOCIAL WORK MATERNAL/CHILD NOTE  Patient Details  Name: Kristina Reyes MRN: [id number] Date of Birth: 05/14/2017  Date:  05/15/2016  Clinical Social Worker Initiating Note:  Terri Piedra, [place] Date/ Time Initiated:  05/15/16/1100     Child's Name:  Kristina Reyes. "Junior"   Legal Guardian:  Other (Reyes) (Parents: Lu Duffel and Joline Salt)   Need for Interpreter:  None   Date of Referral:  05/15/16     Reason for Referral:  Current Substance Use/Substance Use During Pregnancy    Referral Source:  [facility]   Address:  [address], [place], [place], [place] [address]  Phone number:  [phone]   Household Members:  Significant Other   Natural Supports (not living in the home):  Parent, Immediate Family, Friends, Extended Family   Professional Supports: None   Employment:     Type of Work: MOB works for a [employer]   Education:      Museum/gallery curator Resources:  [employer]   Other Resources:  [facility]   Cultural/Religious Considerations Which May Impact Care: None stated.    Strengths:  Ability to meet basic needs , Home prepared for child , Pediatrician chosen    Risk Factors/Current Problems:  None   Cognitive State:  Able to Concentrate , Alert , Linear Thinking    Mood/Affect:  Flat , Calm , Comfortable    CSW Assessment: CSW met with MOB in her first floor room to offer support and complete assessment due to hx of marijuana use.  CSW completed chart review, and notes that MOB was positive for THC on 11/16/15 at her first PNV.  MOB introduced her visitor as her brother and told CSW that we could speak openly with him present.  MOB presented with a somewhat flat affect, however, smiled when she talked about the baby and engaged appropriately while speaking with CSW. MOB reports that labor and delivery went "smooth" and better than she had expected.  She reports having a good support system and everything she needs for baby at home.  She states  baby will sleep in a pack and play and that she is aware of SIDS precautions as reviewed by CSW.  CSW provided education regarding PMADs and encouraged her to speak with her doctor if she has concerns about her emotions at any time.  She denies any hx of mental illness and was receptive to information and resources given by CSW. CSW inquired about marijuana use and MOB reports that she has not smoked marijuana since finding out she was pregnant.  She was understanding of hospital drug screen policy and mandated reporting for positive screens.  MOB denies any other substances.  Baby's UDS is negative.  CSW will monitor CDS results and make a CPS report as indicated.  CSW Plan/Description:  Engineer, mining , Information/Referral to [employer] , No Further Intervention Required/No Barriers to Discharge    Alphonzo Cruise, [place] 05/15/2016, 1:19 PM

## 2016-05-15 NOTE — Lactation Note (Signed)
This note was copied from a baby's chart. Lactation Consultation Note  Patient Name: Kristina Reyes ZOXWR'U Date: 05/15/2016 Reason for consult: Follow-up assessment  Baby 32 hours old. Mom able to pump 15 ml of EBM from each breast for a total of 30 ml of EBM. Discussed EBM storage guidelines, and enc mom to give 15 ml after baby's next feeding. Mom given 5 [place] feeding system with review and enc to use with NS. Plan is for mom to set up the feeding system and nurse baby on shield with cues and at least every 3 hours. Enc giving at least 15 ml of EBM for supplementation. Enc mom to post-pump after baby fed. Enc mom to keep total feeding time to 30 minutes--including time at the breast and supplementation time. Enc mom to call out for assistance as needed.    Maternal Data    Feeding Feeding Type: Formula  LATCH Score/Interventions Latch: Repeated attempts needed to sustain latch, nipple held in mouth throughout feeding, stimulation needed to elicit sucking reflex. Intervention(s): Skin to skin;Waking techniques Intervention(s): Adjust position;Assist with latch;Breast compression  Audible Swallowing: A few with stimulation Intervention(s): Skin to skin;Hand expression  Type of Nipple: Flat  Comfort (Breast/Nipple): Soft / non-tender     Hold (Positioning): Assistance needed to correctly position infant at breast and maintain latch. Intervention(s): Breastfeeding basics reviewed;Support Pillows;Position options;Skin to skin  LATCH Score: 6  Lactation Tools Discussed/Used Tools: Nipple Shields;64F feeding tube / Syringe Nipple shield size: 20   Consult Status Consult Status: Follow-up Date: 05/15/16 Follow-up type: In-patient    Sherlyn Hay 05/15/2016, 2:06 PM

## 2016-05-15 NOTE — Lactation Note (Addendum)
This note was copied from a baby's chart. Lactation Consultation Note  Patient Name: Kristina Reyes ZOXWR'U Date: 05/15/2016 Reason for consult: Follow-up assessment  Baby 30 hours old. Mom called out for a new NS and assistance with latching the baby without the shield. Mom states that the baby is sleepy at the breast and she has not pumped since yesterday. Discussed with mom that nipple everts more with use of NS, and enc attempting to latch without shield when baby more alert and her milk flowing. Assisted mom to latch baby to right breast in football position. Baby latched deeply and suckled in a few bursts. Enc mom to continue nursing for another 10 minutes, then use DEBP and supplement with 10 ml of EBM/formula. Discussed with mom that this LC will return to see how much EBM mom gets when pumping and will demonstrate to parents how to pre-fill NS as well.   Maternal Data    Feeding Feeding Type: Breast Fed  LATCH Score/Interventions Latch: Repeated attempts needed to sustain latch, nipple held in mouth throughout feeding, stimulation needed to elicit sucking reflex. Intervention(s): Skin to skin;Waking techniques Intervention(s): Adjust position;Assist with latch;Breast compression  Audible Swallowing: A few with stimulation Intervention(s): Skin to skin;Hand expression  Type of Nipple: Flat  Comfort (Breast/Nipple): Soft / non-tender     Hold (Positioning): Assistance needed to correctly position infant at breast and maintain latch. Intervention(s): Breastfeeding basics reviewed;Support Pillows;Position options;Skin to skin  LATCH Score: 6  Lactation Tools Discussed/Used Tools: Nipple Shields Nipple shield size: 20   Consult Status Consult Status: Follow-up Date: 05/15/16 Follow-up type: In-patient    Sherlyn Hay 05/15/2016, 12:37 PM

## 2016-05-15 NOTE — Progress Notes (Signed)
POSTPARTUM PROGRESS NOTE  Post Partum Day #1 Subjective:  Kristina Reyes is a 21 y.o. G1P1001 [redacted]w[redacted]d s/p SVD.  No acute events overnight.  Pt denies problems with ambulating, voiding or po intake.  She denies nausea or vomiting.  Pain is well controlled.  She has had flatus. She has not had bowel movement.  Lochia Small.   Objective: Blood pressure 108/60, pulse 68, temperature 97.9 F (36.6 C), resp. rate 18, height  (1.702 m), weight 145 lb (65.8 kg), last menstrual period 08/19/2015, SpO2 98 %, unknown if currently breastfeeding.  Physical Exam:  General: alert, cooperative and no distress Lochia:normal flow Chest: no respiratory distress Heart:regular rate, distal pulses intact Abdomen: soft, nontender,  Uterine Fundus: firm, appropriately tender DVT Evaluation: No calf swelling or tenderness Extremities: mild edema   Recent Labs  05/13/16 1532  HGB 12.8  HCT 36.1    Assessment/Plan:  ASSESSMENT: Kristina Reyes is a 21 y.o. G1P1001 [redacted]w[redacted]d s/p SVD. Patient is doing well, pain is well control and bleeding is minimal. Ambulating denies. HA, dizziness, nausea and vomiting. Anticipate discharge tomorrow.  Plan for discharge tomorrow, Breastfeeding and Contraception undecided (Depo vs IUD vs Nexplanon)   LOS: 2 days   Lovena Neighbours, MD 05/15/2016, 9:07 AM   I confirm that I have verified the information documented in the resident's note and that I have also personally reperformed the physical exam and all medical decision making activities. Tawnya Crook  9:46 AM 05/15/16

## 2016-05-16 ENCOUNTER — Encounter: Payer: Medicaid Other | Admitting: Obstetrics and Gynecology

## 2016-05-16 ENCOUNTER — Ambulatory Visit: Payer: Self-pay

## 2016-05-16 MED ORDER — IBUPROFEN 600 MG PO TABS: 600.0000 mg | ORAL_TABLET | Freq: Four times a day (QID) | ORAL | 0 refills | Status: DC | PRN

## 2016-05-16 MED ORDER — MEASLES, MUMPS & RUBELLA VAC ~~LOC~~ INJ
0.5000 mL | INJECTION | Freq: Once | SUBCUTANEOUS | Status: DC
  Filled 2016-05-16: qty 0.5

## 2016-05-16 NOTE — Discharge Instructions (Signed)

## 2016-05-16 NOTE — Lactation Note (Signed)
This note was copied from a baby's chart. Lactation Consultation Note  Patient Name: Kristina Reyes NGEXB'M Date: 05/16/2016 Reason for consult: Follow-up assessment  Baby 59 hours old. Mom had just called out for assistance with baby because she thought the baby was not breathing while she was attempting to finger and syringe feeding. However, baby breathing and after further discussion--during LC consultation--mom had interpreted the baby's gagging as not being able to breast. Demonstrated to mom how to turn baby over her hand and pat the baby to assist with choking as needed.  Mom reports that she has not been putting the baby to breast since the baby on phototherapy because "it is just too much" for her. Mom has several bottles of EBM in the room and reports that her milk volume is increasing to over an ounce at each pumping session. Discussed supplementation guidelines and assisted mom to use bottle, slow flow nipple and pace feed the baby 25 ml. Baby tolerated well, but did gag twice--so demonstrated to mom how to assist baby and then give baby a few minutes before resuming the feeding. Enc mom to continue pumping after the baby fed, and mom aware of EBM storage guidelines.   Discussed with mom that the baby sleepy d/t hyperbilirubinemia, and enc offering the baby 30 ml--reviewed supplementation guidelines. Enc feeding baby with cues and at least every 3 hours and discussed how stools assist with removal of bilirubin. Discussed assessment and interventions with patient's bedside nurse, Juliette Alcide, RN.    Maternal Data    Feeding Feeding Type: Breast Milk Nipple Type: Slow - flow Length of feed: 10 min  LATCH Score/Interventions                      Lactation Tools Discussed/Used Tools: Pump Breast pump type: Double-Electric Breast Pump   Consult Status Consult Status: Follow-up Date: 05/17/16 Follow-up type: In-patient    Sherlyn Hay 05/16/2016, 5:26  PM

## 2016-05-16 NOTE — Lactation Note (Addendum)
This note was copied from a baby's chart. Lactation Consultation Note  Baby 15 hours old and on double phototherapy.  Baby < 6 lbs. Mother is pumping 35+ml.  At the last feeding she gave baby 27 ml. Discussed increasing volume per day of life and as baby desires. Reviewed volume guidelines. Mother is currently only finger syringe feeding due to phototherapy. Discussed trying to breastfeed today and have LC observe. Mother agreeable. Mom has LC # to call for assist w/next feeding.    Patient Name: Kristina Reyes Today's Date: 05/16/2016     Maternal Data Has patient been taught Hand Expression?: Yes Does the patient have breastfeeding experience prior to this delivery?: No  Feeding Feeding Type: Breast Milk  LATCH Score/Interventions                      Lactation Tools Discussed/Used     Consult Status      Hardie Pulley 05/16/2016, 10:06 AM

## 2016-05-16 NOTE — Discharge Summary (Signed)
OB Discharge Summary     Patient Name: Kristina Reyes DOB: [birth year] MRN: [id number]  Date of admission: 05/13/2016 Delivering MD: Lovena Neighbours   Date of discharge: 05/16/2016  Admitting diagnosis: 39WKS SPOTTING Intrauterine pregnancy: [redacted]w[redacted]d     Secondary diagnosis:  Active Problems:   Normal labor and delivery  Additional problems: None     Discharge diagnosis: Term Pregnancy Delivered                                                                                                Post partum procedures:N/A  Augmentation: AROM and Pitocin  Complications: None  Hospital course:  Onset of Labor With Vaginal Delivery     21 y.o. yo G1P1001 at [redacted]w[redacted]d was admitted in Latent Labor on 05/13/2016. Patient had an uncomplicated labor course as follows:  Membrane Rupture Time/Date: 5:22 AM ,05/14/2016   Intrapartum Procedures: Episiotomy: None [1]                                         Lacerations:  None [1]  Patient had a delivery of a Viable infant. 05/14/2016  Information for the patient's newborn:  Kristina, Reyes [[id number]]  Delivery Method: Vag-Spont    Pateint had an uncomplicated postpartum course.  She is ambulating, tolerating a regular diet, passing flatus, and urinating well. Patient is discharged home in stable condition on 05/16/16.   Physical exam  Vitals:   05/14/16 1748 05/15/16 0645 05/15/16 1721 05/16/16 0605  BP: (!) 100/55 108/60 105/65 99/60  Pulse: 80 68 91 76  Resp: Temp: 99 F (37.2 C) 97.9 F (36.6 C) 98.6 F (37 C) 97.9 F (36.6 C)  TempSrc: Oral  Oral   SpO2:      Weight:      Height:       General: alert, cooperative and no distress Lochia: appropriate Uterine Fundus: firm Incision: N/A DVT Evaluation: No evidence of DVT seen on physical exam. Labs: Lab Results  Component Value Date   WBC 9.9 05/13/2016   HGB 12.8 05/13/2016   HCT 36.1 05/13/2016   MCV 90.5 05/13/2016   PLT 186 05/13/2016   CMP Latest Ref Rng & Units  11/12/2015  Glucose 65 - 99 mg/dL 84  BUN 6 - 20 mg/dL 11  Creatinine 7.82 - 9.56 mg/dL 2.13(Y)  Sodium 865 - 784 mmol/L 135  Potassium 3.5 - 5.1 mmol/L 3.9  Chloride 101 - 111 mmol/L 106  CO2 22 - 32 mmol/L 24  Calcium 8.9 - 10.3 mg/dL 8.9    Discharge instruction: per After Visit Summary and "Baby and Me Booklet".  After visit meds:  Allergies as of 05/16/2016   No Known Allergies     Medication List    TAKE these medications   albuterol 108 (90 Base) MCG/ACT inhaler Commonly known as:  PROVENTIL HFA;VENTOLIN HFA Inhale 2 puffs into the lungs every 6 (six) hours as needed for wheezing or shortness of breath.   docusate sodium  100 MG capsule Commonly known as:  COLACE Take 1 capsule (100 mg total) by mouth 2 (two) times daily.   ibuprofen 600 MG tablet Commonly known as:  ADVIL,MOTRIN Take 1 tablet (600 mg total) by mouth every 6 (six) hours as needed for mild pain or moderate pain.       Diet: routine diet  Activity: Advance as tolerated. Pelvic rest for 6 weeks.   Outpatient follow up:6 weeks Follow up Appt:No future appointments. Follow up Visit:No Follow-up on file.  Postpartum contraception: Depo Provera  Newborn Data: Live born female  Birth Weight: 5 lb 15.2 oz (2699 g) APGAR: 7, 9  Baby Feeding: Breast Disposition:home with mother   05/16/2016 Lovena Neighbours, MD   CNM attestation I have seen and examined this patient and agree with above documentation in the resident's note.   Kristina Reyes is a 21 y.o. G1P1001 s/p SVD.   Pain is well controlled.  Plan for birth control is Depo-Provera.  Method of Feeding: breast  PE:  BP 99/60   Pulse 76   Temp 97.9 F (36.6 C)   Resp 18   Ht  (1.702 m)   Wt 65.8 kg (145 lb)   LMP 08/19/2015   SpO2 98%   Breastfeeding? Unknown   BMI 22.71 kg/m  Fundus firm  No results for input(s): HGB, HCT in the last 72 hours.   Plan: discharge today - postpartum care discussed - f/u clinic in 4-6 weeks for  postpartum visit   Kristina Reyes, CNM 4:53 PM

## 2016-05-17 ENCOUNTER — Encounter (HOSPITAL_COMMUNITY): Payer: Self-pay

## 2016-05-17 ENCOUNTER — Ambulatory Visit: Payer: Self-pay

## 2016-05-17 ENCOUNTER — Ambulatory Visit (HOSPITAL_COMMUNITY): Payer: Medicaid Other

## 2016-05-17 NOTE — Lactation Note (Signed)
This note was copied from a baby's chart. Lactation Consultation Note  Patient Name: Kristina Reyes Date: 05/17/2016 Reason for consult: Follow-up assessment  Baby 42 hours old. Mom reports that baby ate well through the night and has just been taken off phototherapy. Mom has just finished giving baby 60 ml of EBM. Mom states that she will probably start putting baby back to breast first with each feeding now. Enc mom to call for assistance with next latch if still in the hospital. Mom reports that she has a DEBP at home, so enc mom to take kit home with her. Referred mom to "Mother and Baby Care" booklet for number of diapers to expect by day of life and EBM storage guidelines. Mom aware of OP/BFSG and [place] phone line assistance after D/C.   Maternal Data    Feeding Feeding Type: Bottle Fed - Breast Milk Nipple Type: Slow - flow  LATCH Score/Interventions                      Lactation Tools Discussed/Used     Consult Status Consult Status: Complete    Andres Labrum 05/17/2016, 9:02 AM

## 2017-05-27 ENCOUNTER — Encounter: Payer: Self-pay | Admitting: *Deleted

## 2018-02-20 IMAGING — US US MFM FETAL NUCHAL TRANSLUCENCY
1 series · 15 of 28 positions shown · non-contrast
Comparison: none

[Series 1: us mfm fetal nuchal translucency · 15 of 35 slices shown]
[im 1/35]
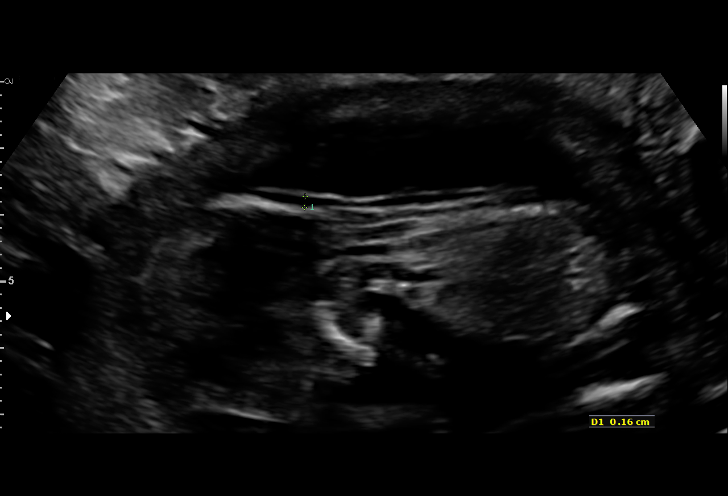
[im 3/35]
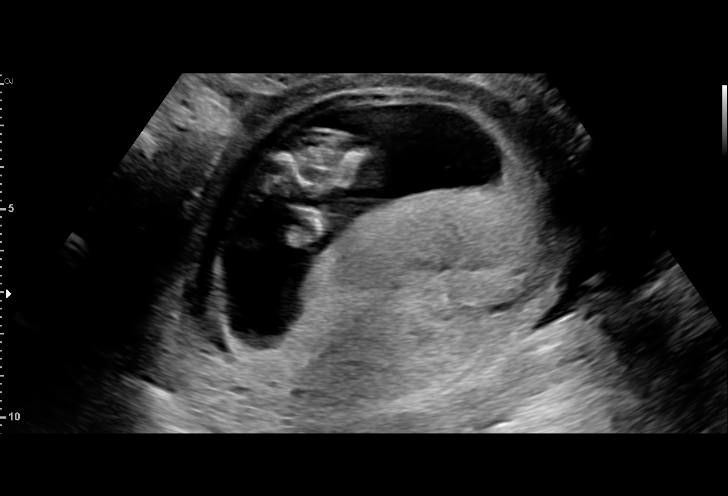
[im 6/35]
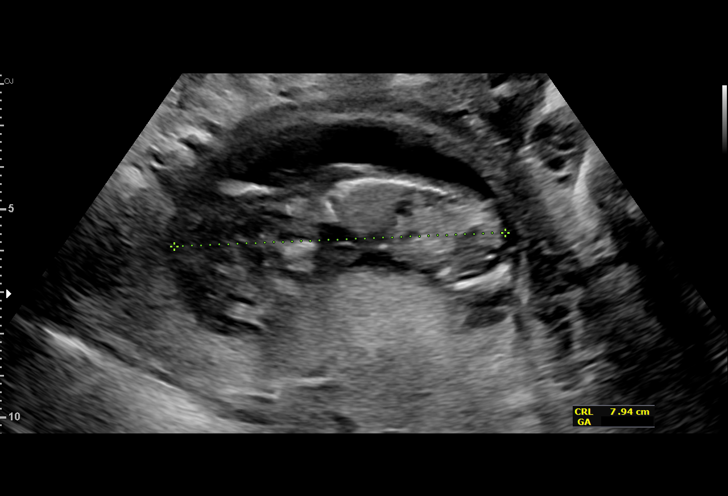
[im 8/35]
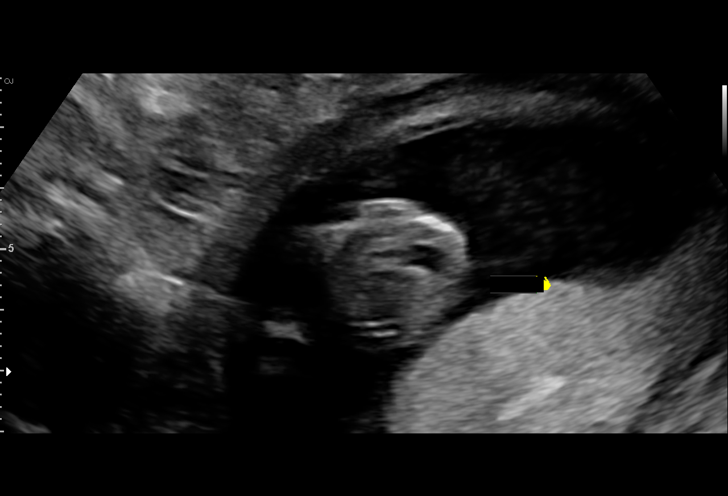
[im 11/35]
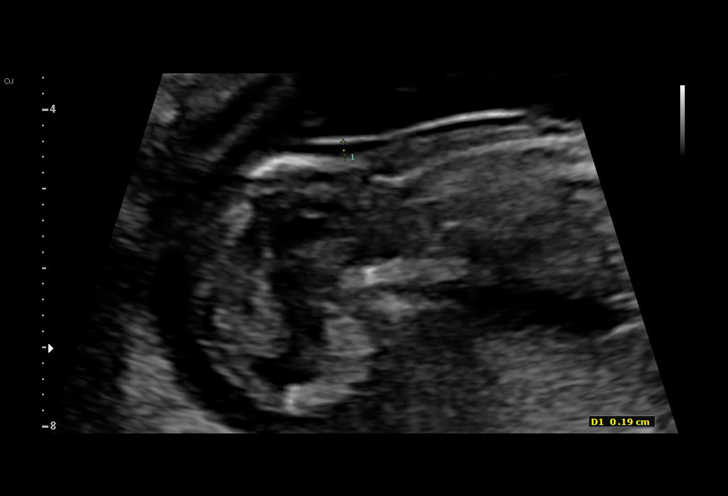
[im 13/35]
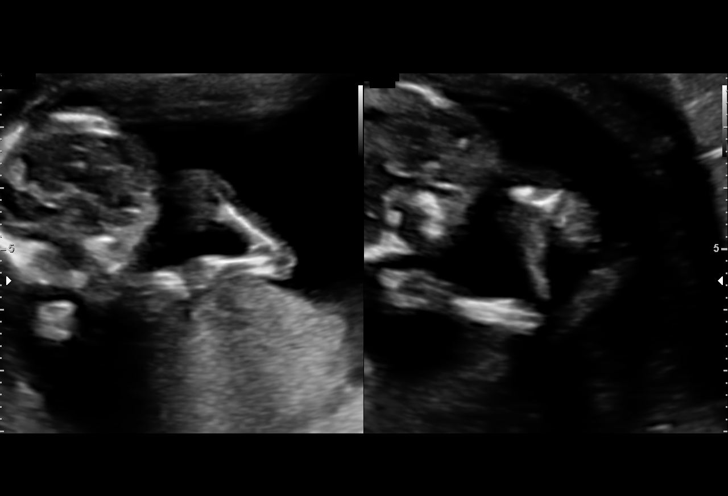
[im 16/35]
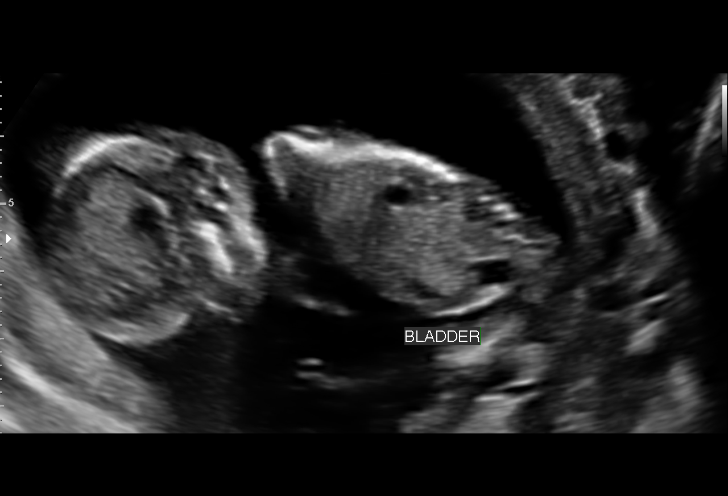
[im 18/35]
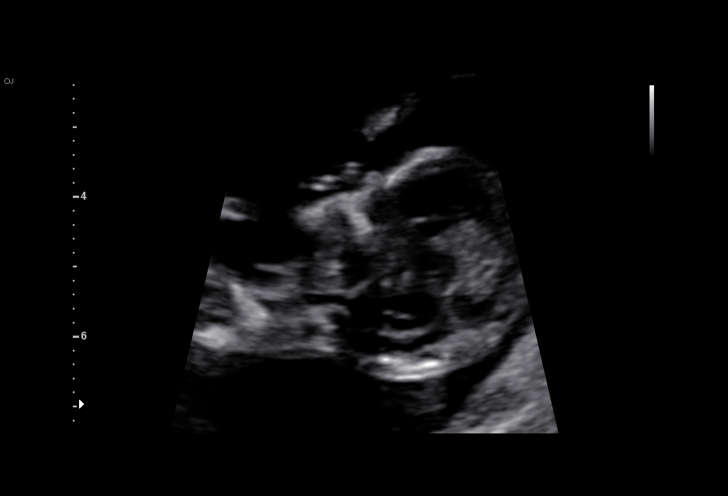
[im 19/35]
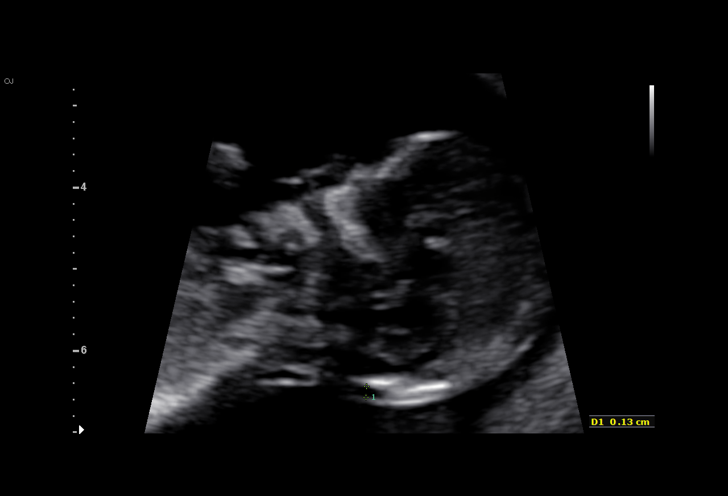
[im 22/35]
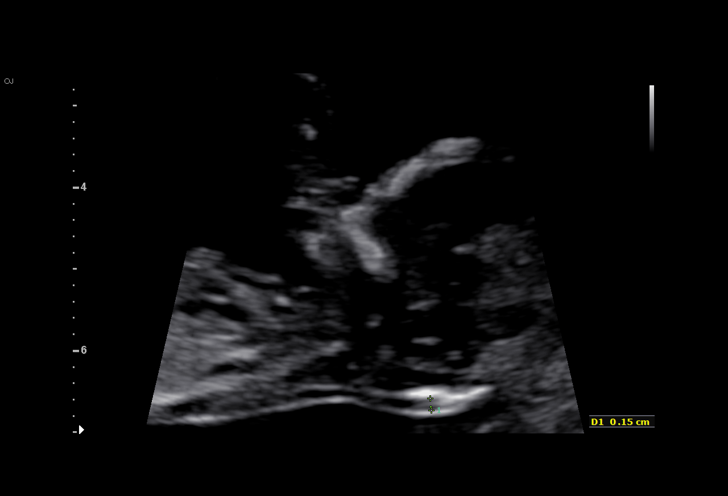
[im 24/35]
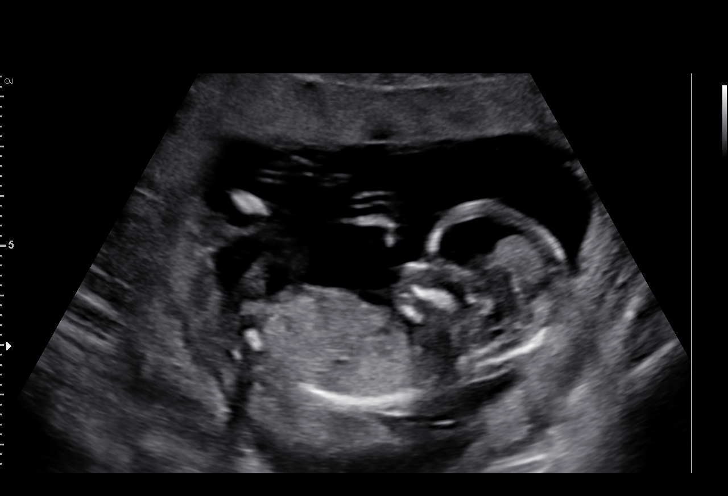
[im 27/35]
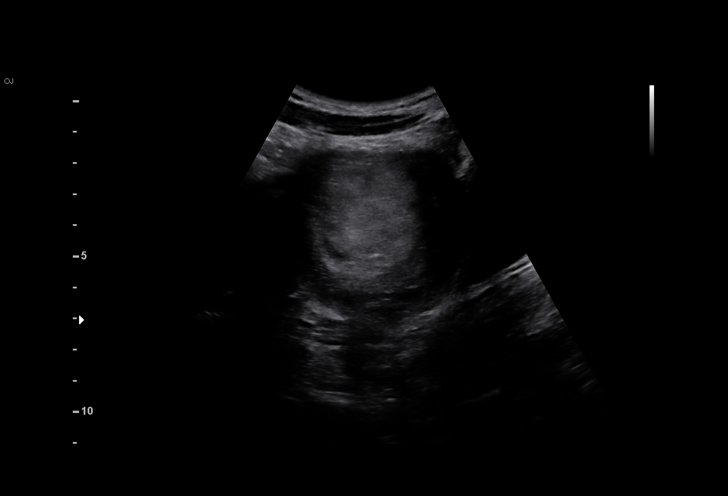
[im 29/35]
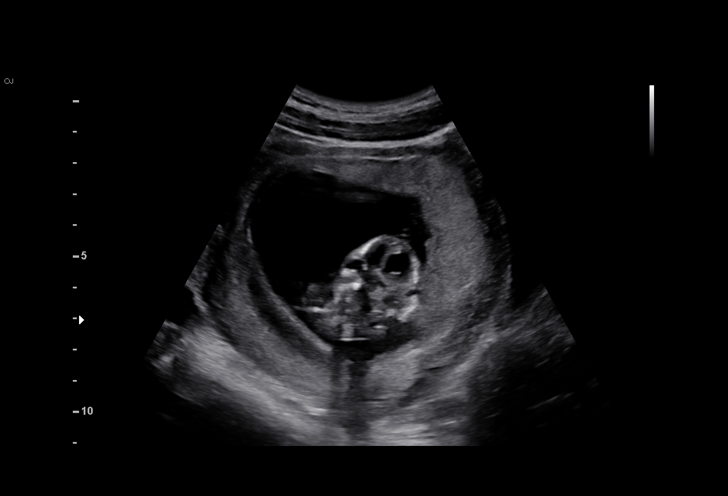
[im 32/35]
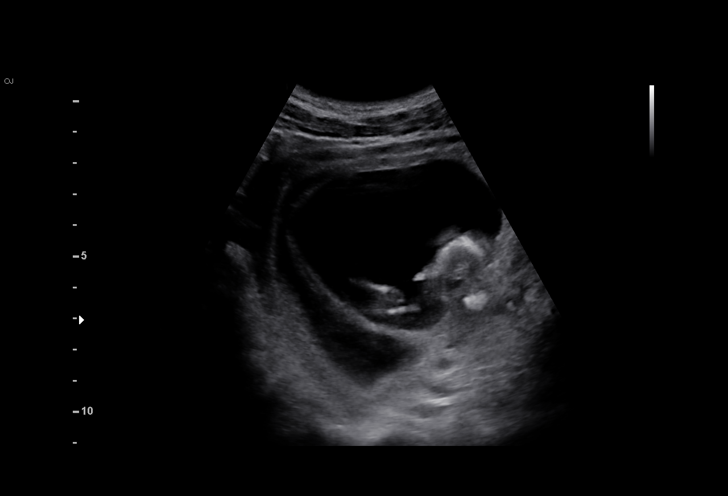
[im 35/35]
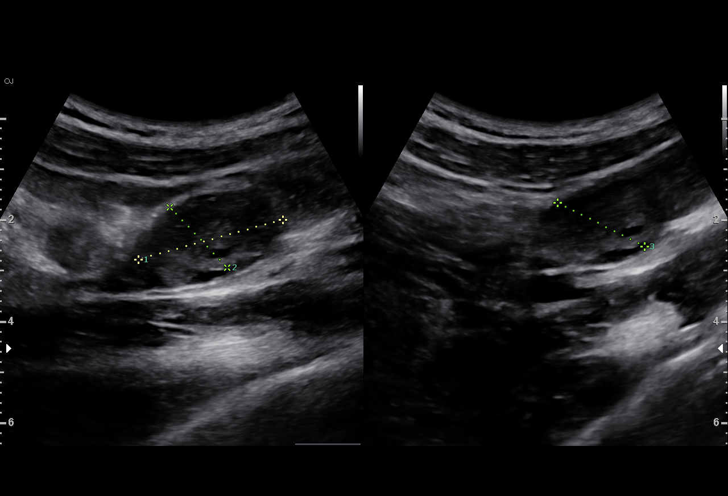

[15 of 28 positions shown; findings below may reference images not displayed]

TRANSLUCENCY

1  SHALLEY JIM          [id number]      [id number]     [id number]
Indications

13 weeks gestation of pregnancy
Encounter for nuchal translucency
OB History

Gravidity:    1         Term:   0        Prem:   0        SAB:   0
TOP:          0       Ectopic:  0        Living: 0
Fetal Evaluation

Num Of Fetuses:     1
Preg. Location:     Intrauterine
Gest. Sac:          Intrauterine
Fetal Heart         168
Rate(bpm):
Cardiac Activity:   Observed

Comment:    Moderate subchorionic hemorrhage noted.
Gestational Age

LMP:           13w 6d       Date:   08/19/15                 EDD:   05/25/16
Best:          13w 6d    Det. By:   LMP  (08/19/15)          EDD:   05/25/16
1st Trimester Genetic Sonogram Screening

CRL:            79.3  mm    G. Age:   13w 4d                 EDD:   05/27/16
Nuc Trans:       1.6  mm
Nasal Bone:                 Present
Anatomy

Cranium:               Appears normal         Bladder:                Appears normal
Choroid Plexus:        Appears normal         Upper Extremities:      Noted
Stomach:               Appears normal, left   Lower Extremities:      Noted
sided
Comments

The patient presented for nuchal translucency screen as part
of a first trimester screen.  Blood drawn for serum analytes
today.
Impression

Single living intrauterine pregnancy at 13 weeks 6 days.
CRL consistent with EDC based on LMP.
Pregnancy should be dated by LMP.
No gross fetal anomalies identified.
Normal nuchal translucency.
Recommendations

Recommend follow-up ultrasound examination in 4-5 weeks
for detailed fetal anatomic survey.

## 2018-03-29 IMAGING — US US OB COMP LESS 14 WK
1 series · 14 of 28 positions shown · non-contrast
Comparison: None.

CLINICAL DATA: 20-year-old female with positive HCG levels
presenting with vaginal bleeding.

EXAM:
OBSTETRIC <14 WK ULTRASOUND
TECHNIQUE: Transabdominal ultrasound was performed for evaluation of the
gestation as well as the maternal uterus and adnexal regions.

[Series 1: us ob comp less 14 wk · 0.23mm/px · 28 acquisitions, 14 frames shown]
[im 2/28]
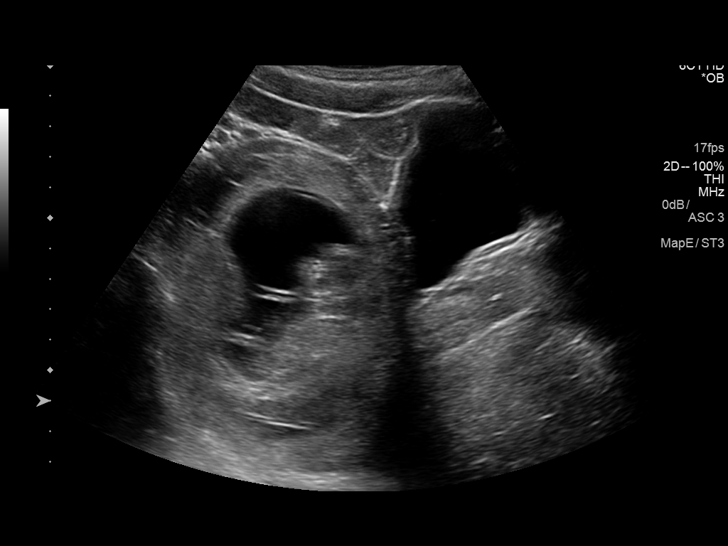
[im 4/28]
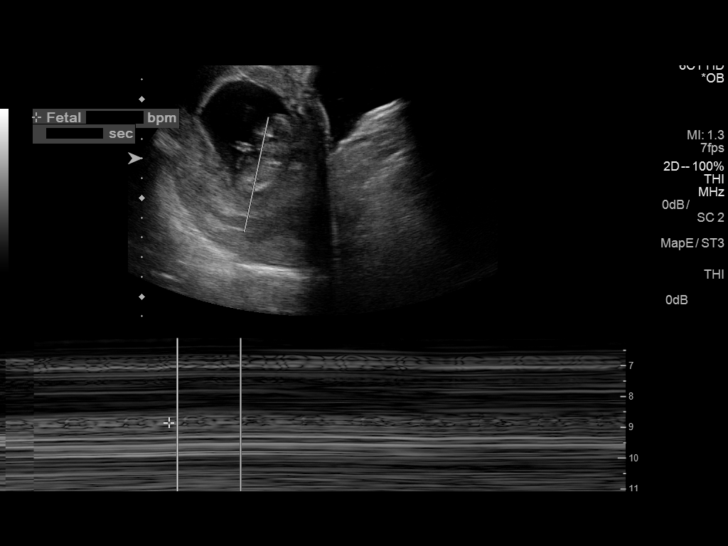
[im 6/28]
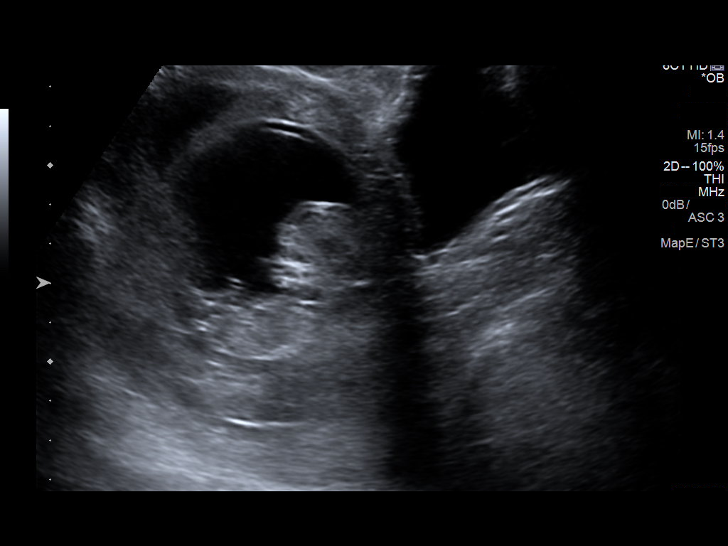
[im 8/28]
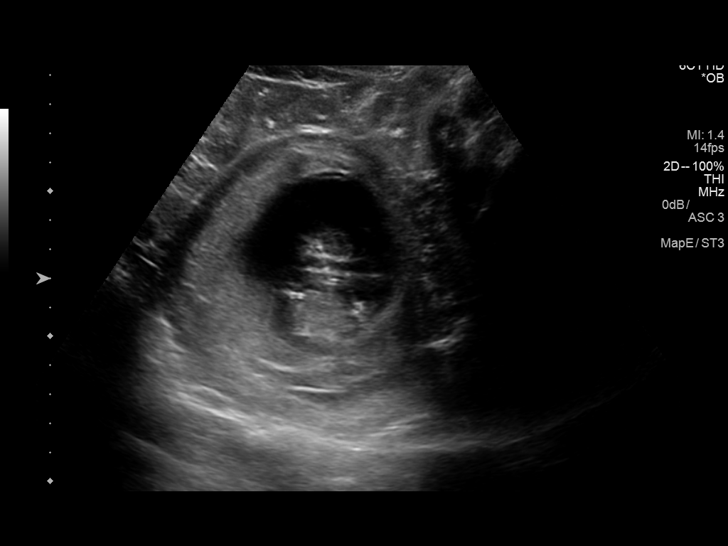
[im 10/28]
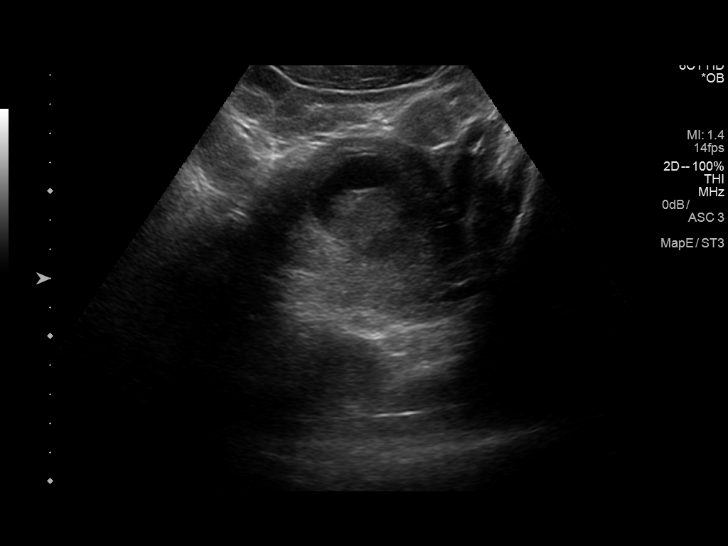
[im 12/28]
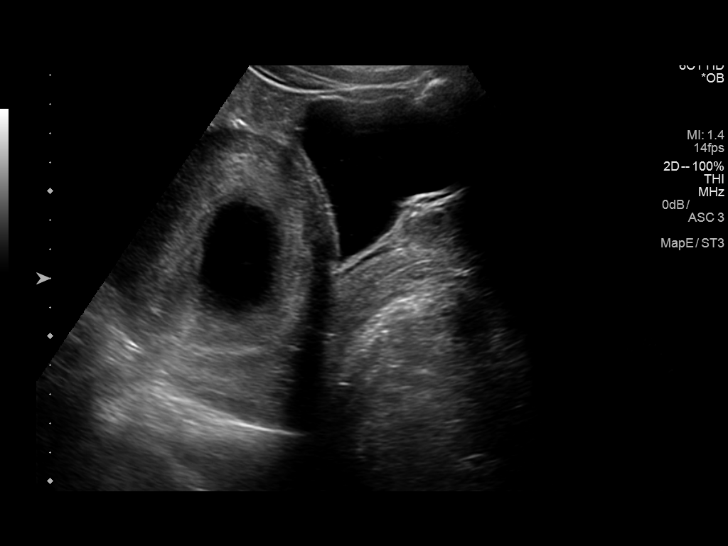
[im 14/28]
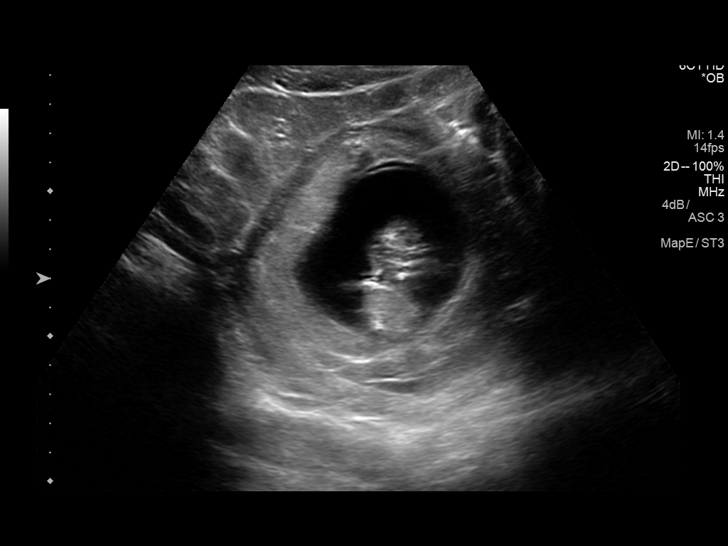
[im 16/28]
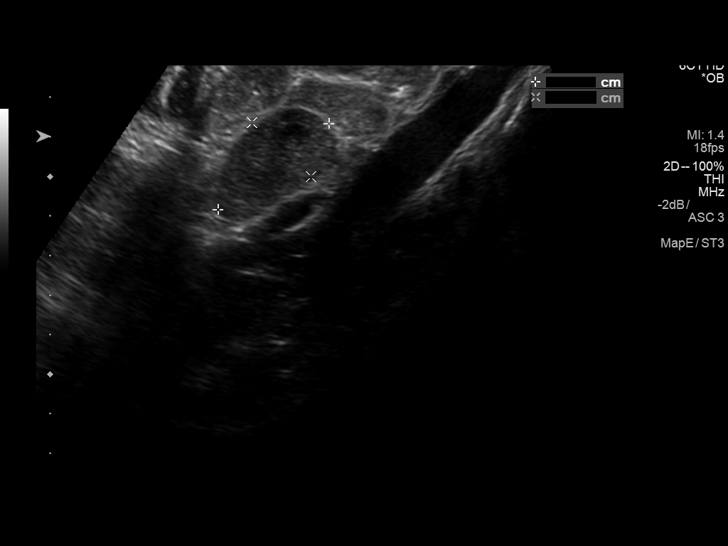
[im 18/28]
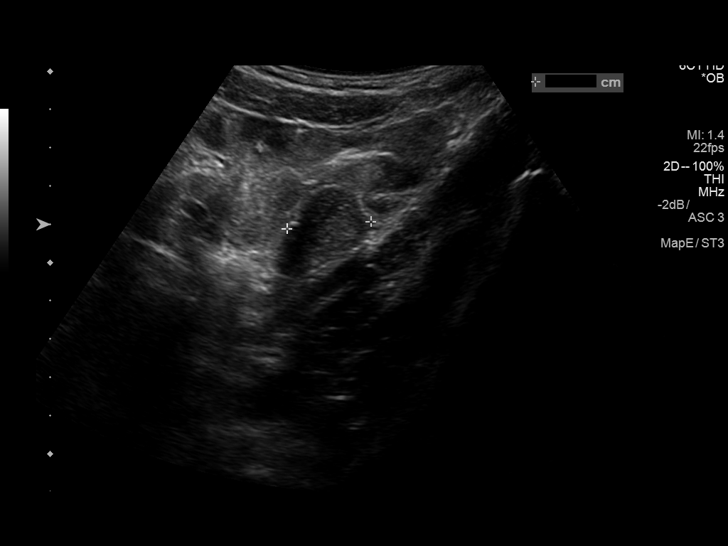
[im 20/28]
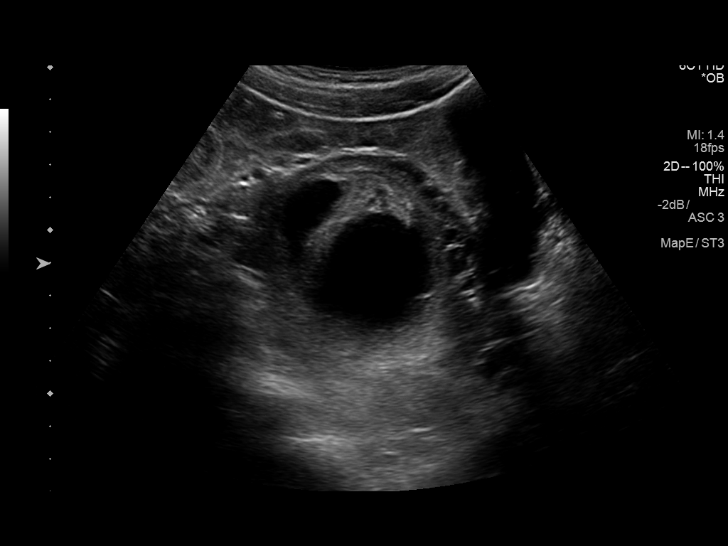
[im 22/28]
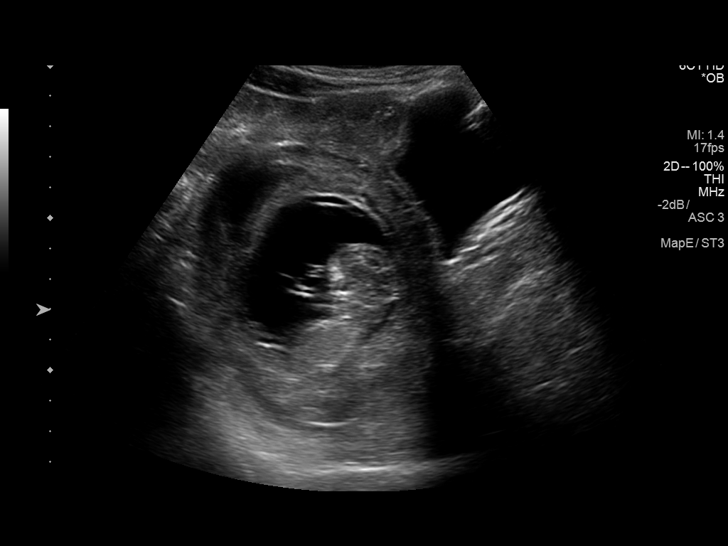
[im 24/28]
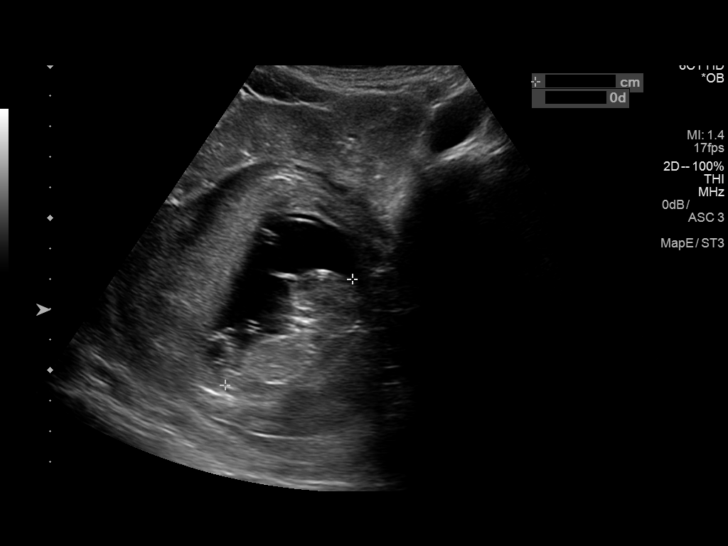
[im 26/28]
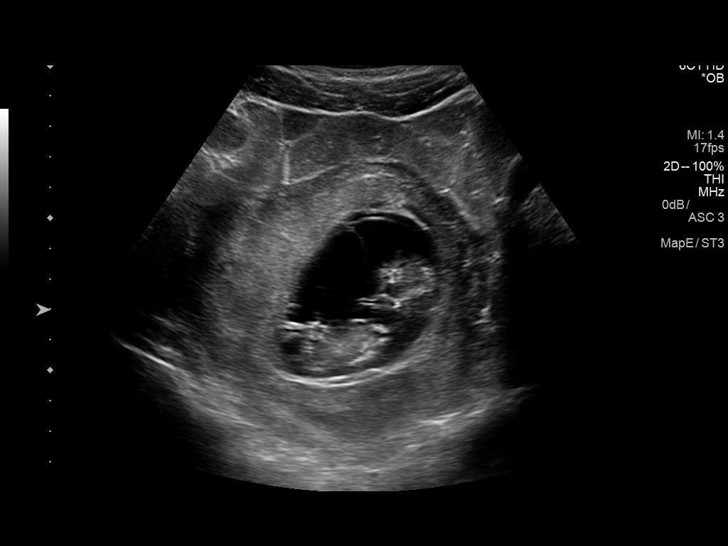
[im 28/28]
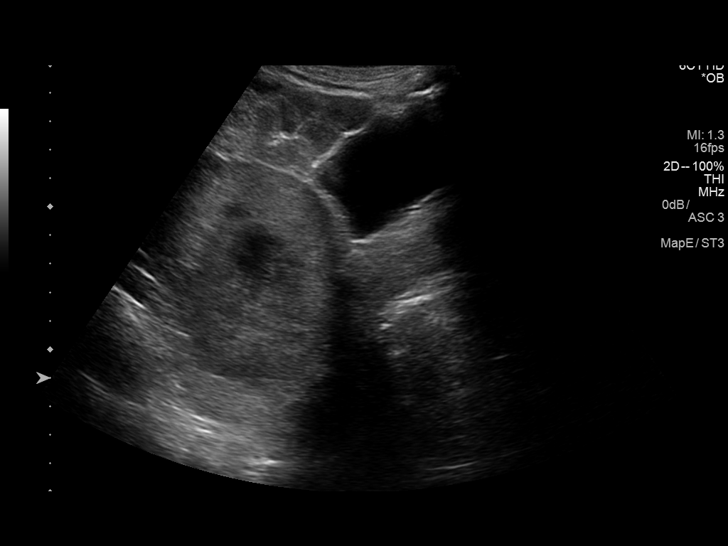

[14 of 28 positions shown; findings below may reference images not displayed]

FINDINGS: Intrauterine gestational sac: Single intrauterine gestational sac.
It

Yolk sac:  Seen

Embryo:  Present

Cardiac Activity: Detected

Heart Rate: 162 bpm

CRL:   53  mm   12 w 0 d                  US EDC: 05/26/2016

Subchorionic hemorrhage: Subchorionic hemorrhage measures 3.3 x
cm.

Maternal uterus/adnexae: The right ovary is not visualized. The left
ovary appears unremarkable.

No significant free fluid within the pelvis.
IMPRESSION: Single live intrauterine pregnancy with an estimated gestational age
of 12 weeks, 0 days.

## 2018-07-22 IMAGING — US US MFM OB FOLLOW-UP
1 series · 14 of 28 positions shown · non-contrast
Comparison: none

[Series 1: us mfm ob follow-up · 37 acquisitions, 14 frames shown]
[im 2/37]
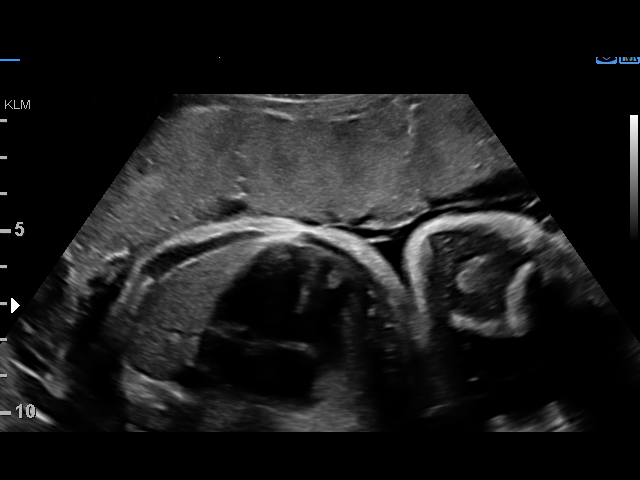
[im 5/37]
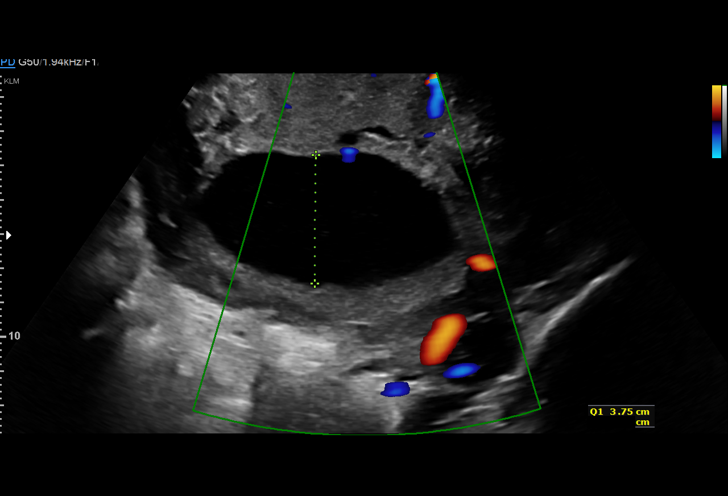
[im 7/37]
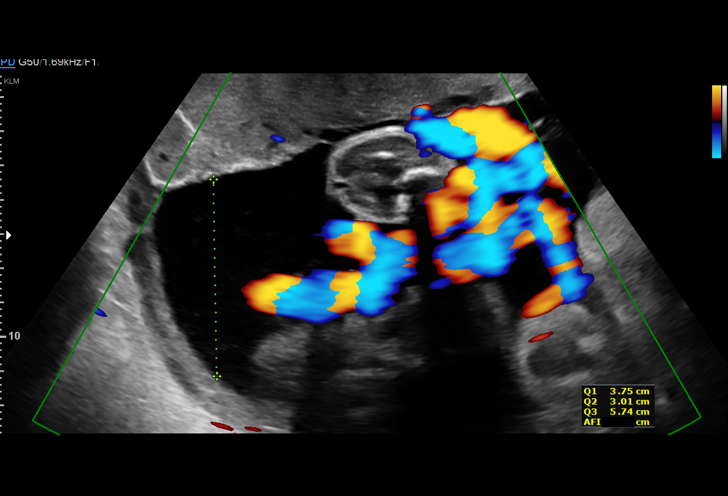
[im 10/37]
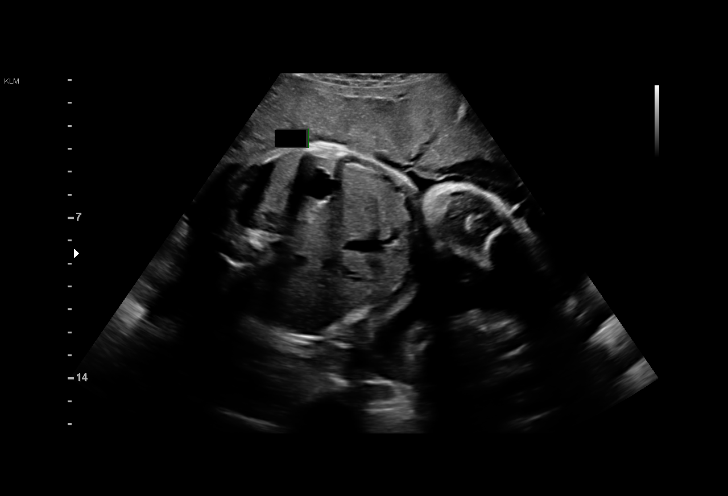
[im 13/37]
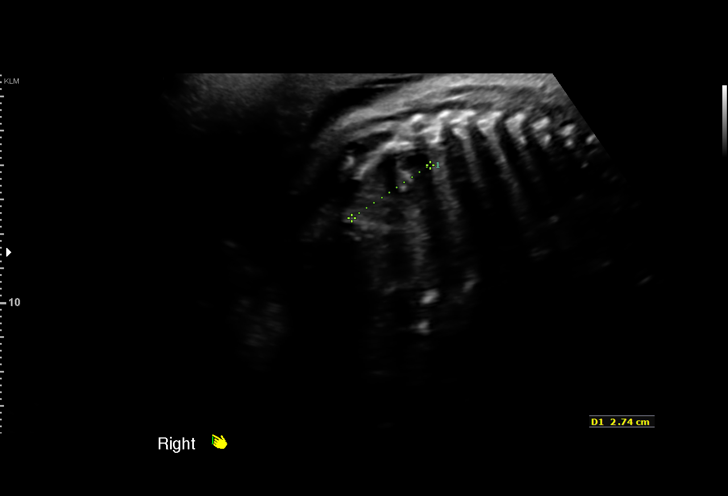
[im 15/37]
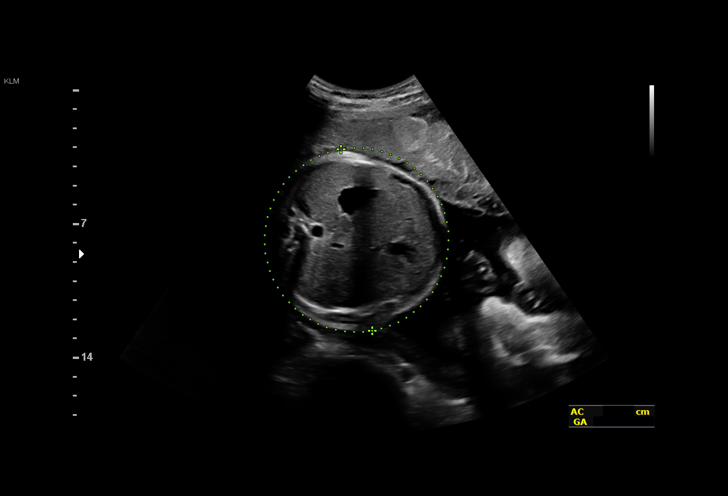
[im 18/37]
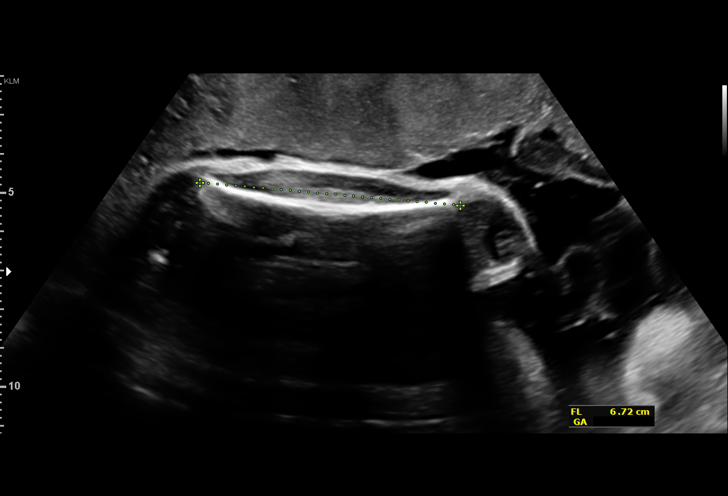
[im 21/37]
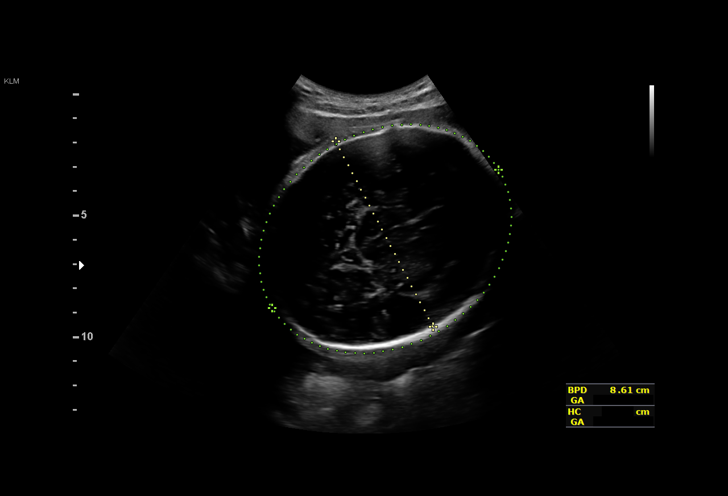
[im 23/37]
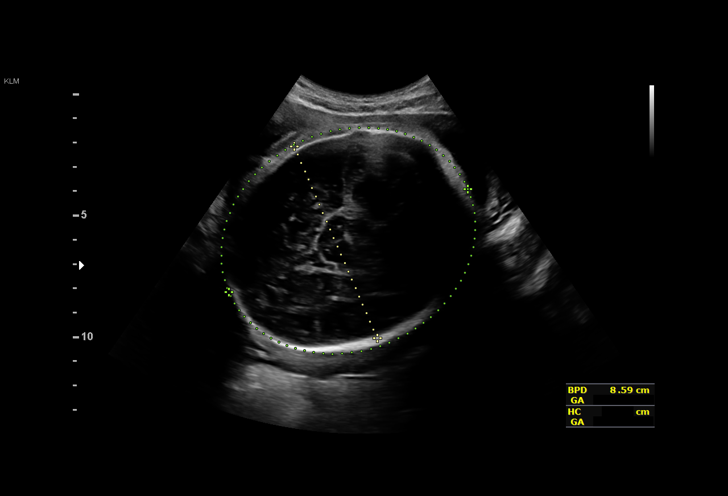
[im 26/37]
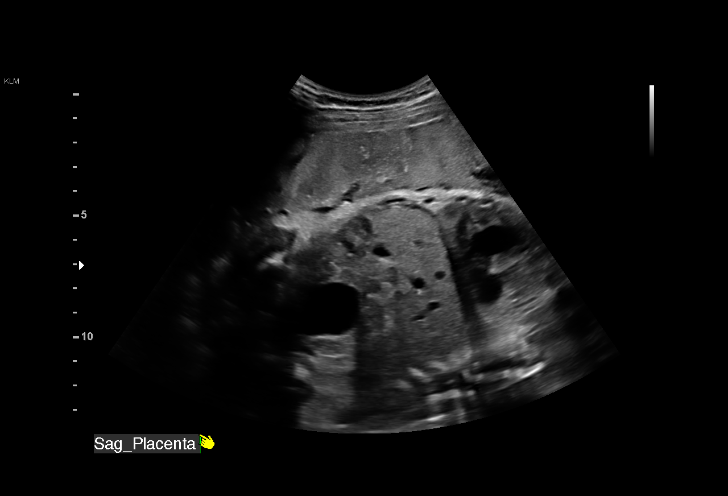
[im 29/37]
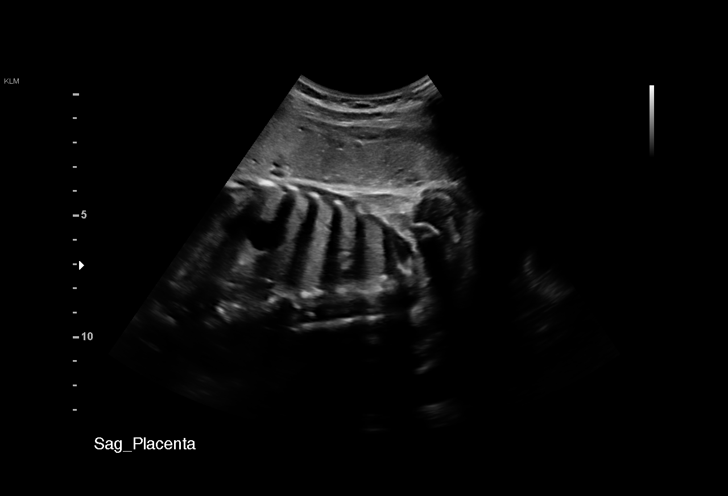
[im 31/37]
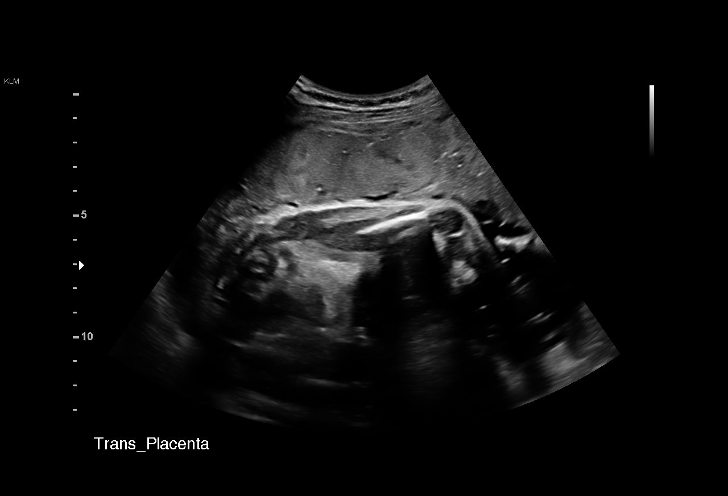
[im 34/37]
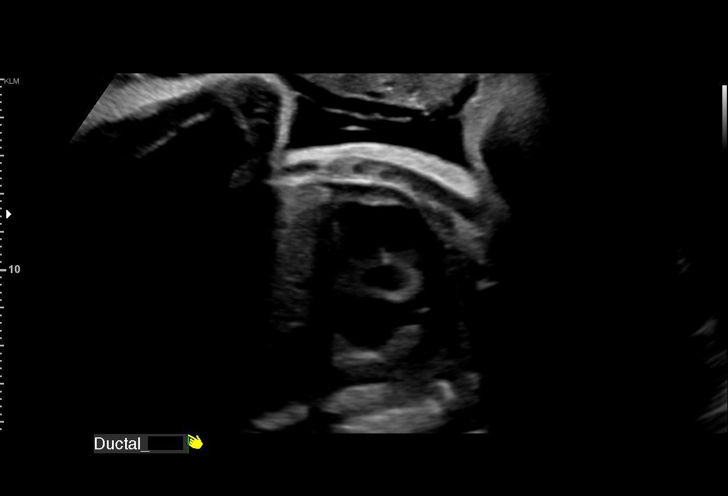
[im 37/37]
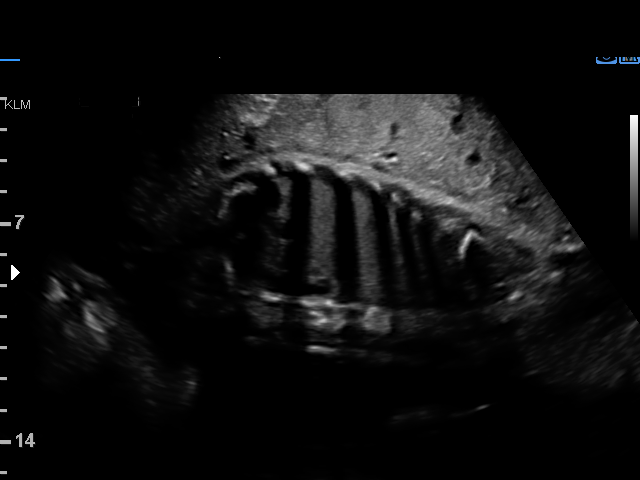

[14 of 28 positions shown; findings below may reference images not displayed]

1  IBADAT LIJA          [id number]      [id number]     [id number]
Indications

35 weeks gestation of pregnancy
Fetal abnormality - other known or
suspected (EIF, renal pyelectasis-resolved)
Uterine size-date discrepancy, third trimester
OB History

Blood Type:            Height:  5'7"   Weight (lb):  141       BMI:
Gravidity:    1         Term:   0        Prem:   0        SAB:   0
TOP:          0       Ectopic:  0        Living: 0
Fetal Evaluation

Num Of Fetuses:     1
Fetal Heart         146
Rate(bpm):
Cardiac Activity:   Observed
Presentation:       Cephalic
Placenta:           Anterior, above cervical os
P. Cord Insertion:  Previously Visualized

Amniotic Fluid
AFI FV:      Subjectively within normal limits

AFI Sum(cm)     %Tile       Largest Pocket(cm)
14.33           52

RUQ(cm)       RLQ(cm)       LUQ(cm)        LLQ(cm)
3.75
Biometry
BPD:      85.4  mm     G. Age:  34w 3d         23  %    CI:        75.78   %    70 - 86
FL/HC:       21.6  %    20.1 -
HC:        311  mm     G. Age:  34w 6d          8  %    HC/AC:       1.03       0.93 -
AC:      302.6  mm     G. Age:  34w 2d         22  %    FL/BPD:      78.7  %    71 - 87
FL:       67.2  mm     G. Age:  34w 4d         21  %    FL/AC:       22.2  %    20 - 24
Est. FW:    8280   gm     5 lb 5 oz     35  %
Gestational Age

LMP:           35w 4d        Date:  08/19/15                 EDD:   05/25/16
U/S Today:     34w 4d                                        EDD:   06/01/16
Best:          35w 4d     Det. By:  LMP  (08/19/15)          EDD:   05/25/16
Anatomy

Cranium:               Appears normal         Aortic Arch:            Previously seen
Cavum:                 Previously seen        Ductal Arch:            Appears normal
Ventricles:            Previously seen        Diaphragm:              Appears normal
Choroid Plexus:        Previously seen        Stomach:                Appears normal, left
sided
Cerebellum:            Previously seen        Abdomen:                Appears normal
Posterior Fossa:       Previously seen        Abdominal Wall:         Previously seen
Nuchal Fold:           Not applicable (>20    Cord Vessels:           Previously seen
wks GA)
Face:                  Orbits and profile     Kidneys:                Appear normal
previously seen
Lips:                  Previously seen        Bladder:                Appears normal
Thoracic:              Appears normal         Spine:                  Previously seen
Heart:                 Appears normal         Upper Extremities:      Previously seen
(4CH, axis, and situs
RVOT:                  Appears normal         Lower Extremities:      Previously seen
LVOT:                  Appears normal

Other:  Fetus appears to be a male. Heels previously seen. Technically
difficult due to fetal position.
Cervix Uterus Adnexa

Cervix
Not visualized (advanced GA >34wks)
Impression

SIUP at 35+5 weeks
Cephalic presentation
Normal interval anatomy; anatomic survey complete
Normal amniotic fluid volume
Appropriate interval growth with EFW at the 35th %tile
Recommendations

Follow-up as clinically indicated

## 2019-06-11 ENCOUNTER — Ambulatory Visit: Payer: Self-pay

## 2020-02-05 ENCOUNTER — Emergency Department (HOSPITAL_COMMUNITY): Admission: EM | Admit: 2020-02-05 | Discharge: 2020-02-05 | Discharge status: Absent without leave | Payer: Self-pay

## 2020-06-12 ENCOUNTER — Other Ambulatory Visit: Payer: Self-pay

## 2020-06-12 ENCOUNTER — Emergency Department
Admission: EM | Admit: 2020-06-12 | Discharge: 2020-06-12 | Disposition: A | Discharge status: Home / Self Care | Payer: Self-pay | Attending: Emergency Medicine | Admitting: Emergency Medicine

## 2020-06-12 ENCOUNTER — Encounter: Payer: Self-pay | Admitting: Emergency Medicine

## 2020-06-12 DIAGNOSIS — J020 Streptococcal pharyngitis: Secondary | ICD-10-CM | POA: Insufficient documentation

## 2020-06-12 DIAGNOSIS — J45909 Unspecified asthma, uncomplicated: Secondary | ICD-10-CM | POA: Insufficient documentation

## 2020-06-12 DIAGNOSIS — Z20822 Contact with and (suspected) exposure to covid-19: Secondary | ICD-10-CM | POA: Insufficient documentation

## 2020-06-12 DIAGNOSIS — R059 Cough, unspecified: Secondary | ICD-10-CM | POA: Insufficient documentation

## 2020-06-12 DIAGNOSIS — R0981 Nasal congestion: Secondary | ICD-10-CM | POA: Insufficient documentation

## 2020-06-12 MED ORDER — AMOXICILLIN 875 MG PO TABS: 875.0000 mg | ORAL_TABLET | Freq: Two times a day (BID) | ORAL | 0 refills | Status: DC

## 2020-06-12 MED ORDER — MAGIC MOUTHWASH W/LIDOCAINE: 5.0000 mL | Freq: Four times a day (QID) | ORAL | 0 refills | Status: DC

## 2020-06-12 NOTE — ED Provider Notes (Signed)
[facility] Emergency Department Provider Note  ____________________________________________  Time seen: Approximately 3:57 PM  I have reviewed the triage vital signs and the nursing notes.   HISTORY  Chief Complaint Sore Throat and Fever    HPI Kristina Reyes is a 25 y.o. female who presents the emergency department complaining of fever, nasal congestion, sore throat, cough.  Patient's major complaint is sore throat at this time.  She states that she believes that she has strep.  No difficulty breathing.  No difficulty swallowing other than pain.  Patient states that it has been years since she had strep but this feels similar.  Patient works at [employer] and was sent home due to the fact that she had a fever.  Her temp max has been 100.9 F.  Patient denies any neck pain or stiffness, chest pain, shortness of breath, abdominal pain, nausea vomiting, diarrhea or constipation.         Past Medical History:  Diagnosis Date  . Asthma     Patient Active Problem List   Diagnosis Date Noted  . Normal labor and delivery 05/13/2016  . Decreased fetal movement determined by examination, third trimester 05/01/2016  . Fundal height low for dates in third trimester 04/17/2016  . History of marijuana use 11/20/2015  . GBS bacteriuria 11/18/2015  . Supervision of normal first pregnancy, antepartum 11/16/2015    Past Surgical History:  Procedure Laterality Date  . arm surgery Left    BB removed from left forearm.     Prior to Admission medications   Medication Sig Start Date End Date Taking? Authorizing Provider  amoxicillin (AMOXIL) 875 MG tablet Take 1 tablet (875 mg total) by mouth 2 (two) times daily. 06/12/20  Yes Wana Mount, Delorise Royals, PA-C  magic mouthwash w/lidocaine SOLN Take 5 mLs by mouth 4 (four) times daily. 06/12/20  Yes Herschel Fleagle, Delorise Royals, PA-C  albuterol (PROVENTIL HFA;VENTOLIN HFA) 108 (90 Base) MCG/ACT inhaler Inhale 2 puffs into the lungs every 6  (six) hours as needed for wheezing or shortness of breath. Patient not taking: Reported on 05/09/2016 03/13/16   Sharen Counter A, CNM  docusate sodium (COLACE) 100 MG capsule Take 1 capsule (100 mg total) by mouth 2 (two) times daily. Patient not taking: Reported on 05/09/2016 03/06/16   [name], MD  ibuprofen (ADVIL,MOTRIN) 600 MG tablet Take 1 tablet (600 mg total) by mouth every 6 (six) hours as needed for mild pain or moderate pain. 05/16/16   Lovena Neighbours, MD    Allergies Patient has no known allergies.  Family History  Problem Relation Age of Onset  . Asthma Mother   . Asthma Brother     Social History Social History   Tobacco Use  . Smoking status: Never Smoker  . Smokeless tobacco: Never Used  Substance Use Topics  . Alcohol use: No    Alcohol/week: 0.0 standard drinks  . Drug use: No     Review of Systems  Constitutional: No fever/chills Eyes: No visual changes. No discharge ENT: Positive for nasal congestion Cardiovascular: no chest pain. Respiratory: Positive cough. No SOB. Gastrointestinal: No abdominal pain.  No nausea, no vomiting.  No diarrhea.  No constipation. Musculoskeletal: Negative for musculoskeletal pain. Skin: Negative for rash, abrasions, lacerations, ecchymosis. Neurological: Negative for headaches, focal weakness or numbness.  10 System ROS otherwise negative.  ____________________________________________   PHYSICAL EXAM:  VITAL SIGNS: ED Triage Vitals  Enc Vitals Group     BP 06/12/20 1259 113/82  Pulse Rate 06/12/20 1259 90     Resp 06/12/20 1259 16     Temp 06/12/20 1259 98.5 F (36.9 C)     Temp Source 06/12/20 1259 Oral     SpO2 06/12/20 1259 100 %     Weight 06/12/20 1259 126 lb (57.2 kg)     Height 06/12/20 1259 5\' 7"  (1.702 m)     Head Circumference --      Peak Flow --      Pain Score 06/12/20 1258 8     Pain Loc --      Pain Edu? --      Excl. in [place]? --      Constitutional: Alert and oriented.  Well appearing and in no acute distress. Eyes: Conjunctivae are normal. PERRL. EOMI. Head: Atraumatic. ENT:      Ears: EACs and TMs unremarkable      Nose: No congestion/rhinnorhea.      Mouth/Throat: Mucous membranes are moist.  Tonsils are erythematous and edematous bilaterally.  Exudates bilaterally.  Uvula is midline.  Patient does have a raspy voice. Neck: No stridor.  Neck is supple with full range of motion Hematological/Lymphatic/Immunilogical: Diffuse, mobile, tender anterior cervical lymphadenopathy. Cardiovascular: Normal rate, regular rhythm. Normal S1 and S2.  Good peripheral circulation. Respiratory: Normal respiratory effort without tachypnea or retractions. Lungs CTAB. Good air entry to the bases with no decreased or absent breath sounds. Gastrointestinal: Bowel sounds 4 quadrants. Soft and nontender to palpation. No guarding or rigidity. No palpable masses. No distention. No CVA tenderness. Musculoskeletal: Full range of motion to all extremities. No gross deformities appreciated. Neurologic:  Normal speech and language. No gross focal neurologic deficits are appreciated.  Skin:  Skin is warm, dry and intact. No rash noted. Psychiatric: Mood and affect are normal. Speech and behavior are normal. Patient exhibits appropriate insight and judgement.   ____________________________________________   LABS (all labs ordered are listed, but only abnormal results are displayed)  Labs Reviewed  SARS CORONAVIRUS 2 (TAT 6-24 HRS)   ____________________________________________  EKG   ____________________________________________  RADIOLOGY   No results found.  ____________________________________________    PROCEDURES  Procedure(s) performed:    Procedures    Medications - No data to display   ____________________________________________   INITIAL IMPRESSION / ASSESSMENT AND PLAN / ED COURSE  Pertinent labs & imaging results that were available during my  care of the patient were reviewed by me and considered in my medical decision making (see chart for details).  Review of the [place] CSRS was performed in accordance of the [facility] prior to dispensing any controlled drugs.           Patient's diagnosis is consistent with strep throat.  Patient presented to the emergency department complaining of fever, mild nasal congestion, sore throat and cough.  Patient's primary complaint is sore throat and she states that she feels like she has strep.  Based off of physical exam I think this is likely most accurate diagnosis.  Patient be treated with amoxicillin and Magic mouthwash for same.  Patient would like to be tested for COVID as she works at [employer]20 which I also feel is reasonable and this will be performed at this time.  She will check the results on MyChart.  Tylenol, Motrin, plenty of fluids.  Return precautions discussed with the patient.  Follow-up primary care as needed.  Patient is given ED precautions to return to the ED for any worsening or new symptoms.     ____________________________________________  FINAL CLINICAL IMPRESSION(S) / ED DIAGNOSES  Final diagnoses:  Strep pharyngitis      NEW MEDICATIONS STARTED DURING THIS VISIT:  ED Discharge Orders         Ordered    amoxicillin (AMOXIL) 875 MG tablet  2 times daily        06/12/20 1626    magic mouthwash w/lidocaine SOLN  4 times daily       Note to Pharmacy: Dispense in a 1/1/1 ratio. Use lidocaine, diphenhydramine, prednisolone   06/12/20 1626              This chart was dictated using voice recognition software/Dragon. Despite best efforts to proofread, errors can occur which can change the meaning. Any change was purely unintentional.    Racheal Patches, PA-C 06/12/20 1723    Merwyn Katos, MD 06/13/20 [phone]

## 2020-06-12 NOTE — ED Notes (Signed)
45

## 2020-06-12 NOTE — ED Triage Notes (Signed)
Pt c/o sore throat and fever x 3 days. Pt states Tmax at home 100.9, a-febrile on arrival, deneis taking antipyretics prior to arrival.

## 2020-06-12 NOTE — ED Notes (Signed)
See triage note  Presents with sore throat for couple of weeks  Subjective fever at home afebrile on arrival  Increased pain with swallowing

## 2020-06-13 LAB — SARS CORONAVIRUS 2 (TAT 6-24 HRS): SARS Coronavirus 2: NEGATIVE

## 2021-02-12 ENCOUNTER — Ambulatory Visit
Admission: EM | Admit: 2021-02-12 | Discharge: 2021-02-12 | Disposition: A | Discharge status: Home / Self Care | Payer: Self-pay | Attending: Emergency Medicine | Admitting: Emergency Medicine

## 2021-02-12 ENCOUNTER — Encounter: Payer: Self-pay | Admitting: Emergency Medicine

## 2021-02-12 DIAGNOSIS — N898 Other specified noninflammatory disorders of vagina: Secondary | ICD-10-CM | POA: Insufficient documentation

## 2021-02-12 DIAGNOSIS — Z113 Encounter for screening for infections with a predominantly sexual mode of transmission: Secondary | ICD-10-CM | POA: Insufficient documentation

## 2021-02-12 DIAGNOSIS — R21 Rash and other nonspecific skin eruption: Secondary | ICD-10-CM | POA: Insufficient documentation

## 2021-02-12 LAB — POCT URINALYSIS DIP (MANUAL ENTRY)
Bilirubin, UA: NEGATIVE
Glucose, UA: NEGATIVE mg/dL
Ketones, POC UA: NEGATIVE mg/dL
Nitrite, UA: NEGATIVE
Protein Ur, POC: NEGATIVE mg/dL
Spec Grav, UA: >=1.03 — AB (ref 1.010–1.025)
Urobilinogen, UA: 0.2 E.U./dL
pH, UA: 6 (ref 5.0–8.0)

## 2021-02-12 LAB — POCT URINE PREGNANCY: Preg Test, Ur: NEGATIVE

## 2021-02-12 NOTE — ED Triage Notes (Signed)
Pt c/o vaginal itching x 3 days. She would like STD testing.

## 2021-02-12 NOTE — Discharge Instructions (Signed)
Your vaginal and blood tests are pending.  If your test results are positive, we will call you.  You and your sexual partner(s) may require treatment at that time.  Do not have sexual activity for at least 7 days.    Follow up with your primary care provider or gynecologist if your symptoms are not improving.

## 2021-02-12 NOTE — ED Provider Notes (Signed)
Roderic Palau    CSN: [id number] Arrival date & time: 02/12/21  1529      History   Chief Complaint Chief Complaint  Patient presents with   Vaginal Itching    HPI Kristina Reyes is a 26 y.o. female.  Patient presents with a rash on her labia for several days.  The small sores mildly painful and pruritic.  She recently waxed and shaved her pubic hair.  She denies vaginal discharge, pelvic pain, abdominal pain, dysuria, hematuria, flank pain, or other symptoms.  No treatments at home.  The history is provided by the patient.   Past Medical History:  Diagnosis Date   Asthma     Patient Active Problem List   Diagnosis Date Noted   Normal labor and delivery 05/13/2016   Decreased fetal movement determined by examination, third trimester 05/01/2016   Fundal height low for dates in third trimester 04/17/2016   History of marijuana use 11/20/2015   GBS bacteriuria 11/18/2015   Supervision of normal first pregnancy, antepartum 11/16/2015    Past Surgical History:  Procedure Laterality Date   arm surgery Left    BB removed from left forearm.     OB History     Gravida  1   Para  1   Term  1   Preterm  0   AB  0   Living  1      SAB  0   IAB  0   Ectopic  0   Multiple  0   Live Births  1            Home Medications    Prior to Admission medications   Medication Sig Start Date End Date Taking? Authorizing Provider  albuterol (PROVENTIL HFA;VENTOLIN HFA) 108 (90 Base) MCG/ACT inhaler Inhale 2 puffs into the lungs every 6 (six) hours as needed for wheezing or shortness of breath. Patient not taking: Reported on 05/09/2016 03/13/16   Elvera Maria, CNM  amoxicillin (AMOXIL) 875 MG tablet Take 1 tablet (875 mg total) by mouth 2 (two) times daily. 06/12/20   Cuthriell, Charline Bills, PA-C  docusate sodium (COLACE) 100 MG capsule Take 1 capsule (100 mg total) by mouth 2 (two) times daily. Patient not taking: Reported on 05/09/2016 03/06/16    Eloise Levels, MD  ibuprofen (ADVIL,MOTRIN) 600 MG tablet Take 1 tablet (600 mg total) by mouth every 6 (six) hours as needed for mild pain or moderate pain. 05/16/16   Diallo, Earna Coder, MD  magic mouthwash w/lidocaine SOLN Take 5 mLs by mouth 4 (four) times daily. 06/12/20   Cuthriell, Charline Bills, PA-C    Family History Family History  Problem Relation Age of Onset   Asthma Mother    Asthma Brother     Social History Social History   Tobacco Use   Smoking status: Never   Smokeless tobacco: Never  Vaping Use   Vaping Use: Never used  Substance Use Topics   Alcohol use: No    Alcohol/week: 0.0 standard drinks   Drug use: No     Allergies   Patient has no known allergies.   Review of Systems Review of Systems  Constitutional:  Negative for chills and fever.  Gastrointestinal:  Negative for abdominal pain and vomiting.  Genitourinary:  Negative for dysuria, flank pain, hematuria, pelvic pain and vaginal discharge.  Skin:  Positive for rash. Negative for color change.  All other systems reviewed and are negative.   Physical Exam Triage Vital  Signs ED Triage Vitals  Enc Vitals Group     BP      Pulse      Resp      Temp      Temp src      SpO2      Weight      Height      Head Circumference      Peak Flow      Pain Score      Pain Loc      Pain Edu?      Excl. in [place]?    No data found.  Updated Vital Signs BP 116/75 (BP Location: Left Arm)    Pulse 81    Temp 98.7 F (37.1 C) (Oral)    Resp 18    LMP 02/07/2021    SpO2 97%   Visual Acuity Right Eye Distance:   Left Eye Distance:   Bilateral Distance:    Right Eye Near:   Left Eye Near:    Bilateral Near:     Physical Exam Vitals and nursing note reviewed. Exam conducted with a chaperone present Janeal Holmes, RN).  Constitutional:      General: She is not in acute distress.    Appearance: She is well-developed. She is not ill-appearing.  HENT:     Head: Atraumatic.     Mouth/Throat:     Mouth:  Mucous membranes are moist.  Cardiovascular:     Rate and Rhythm: Normal rate and regular rhythm.     Heart sounds: Normal heart sounds.  Pulmonary:     Effort: Pulmonary effort is normal. No respiratory distress.     Breath sounds: Normal breath sounds.  Abdominal:     Palpations: Abdomen is soft.     Tenderness: There is no abdominal tenderness. There is no right CVA tenderness, left CVA tenderness, guarding or rebound.  Genitourinary:    Exam position: Lithotomy position.     Vagina: No vaginal discharge.     Comments: Scattered papules on labia. No drainage.  No vaginal discharge.  Musculoskeletal:        General: No swelling.     Cervical back: Neck supple.  Skin:    General: Skin is warm and dry.     Capillary Refill: Capillary refill takes less than 2 seconds.  Neurological:     Mental Status: She is alert.  Psychiatric:        Mood and Affect: Mood normal.        Behavior: Behavior normal.     UC Treatments / Results  Labs (all labs ordered are listed, but only abnormal results are displayed) Labs Reviewed  POCT URINALYSIS DIP (MANUAL ENTRY) - Abnormal; Notable for the following components:      Result Value   Spec Grav, UA >=1.030 (*)    Blood, UA small (*)    Leukocytes, UA Trace (*)    All other components within normal limits  HSV CULTURE AND TYPING  RPR  HIV ANTIBODY (ROUTINE TESTING W REFLEX)  POCT URINE PREGNANCY  CERVICOVAGINAL ANCILLARY ONLY    EKG   Radiology No results found.  Procedures Procedures (including critical care time)  Medications Ordered in UC Medications - No data to display  Initial Impression / Assessment and Plan / UC Course  I have reviewed the triage vital signs and the nursing notes.  Pertinent labs & imaging results that were available during my care of the patient were reviewed by me and considered in my  medical decision making (see chart for details).   Rash of genital area, vaginal irritation, STD testing.   Vaginal swab obtained for STD testing. Swab of lesions obtained for HSV testing.  Blood obtained for HIV and Syphilis testing.  Discussed that we will call if test results are positive.  Discussed that she and her sexual partner(s) may require treatment.  Instructed patient to abstain from sexual activity for at least 7 days.  Instructed her to follow-up with her PCP or gynecologist if her symptoms are not improving.  Patient agrees to plan of care.    Final Clinical Impressions(s) / UC Diagnoses   Final diagnoses:  Screening for STD (sexually transmitted disease)  Vaginal irritation  Rash of genital area     Discharge Instructions      Your vaginal and blood tests are pending.  If your test results are positive, we will call you.  You and your sexual partner(s) may require treatment at that time.  Do not have sexual activity for at least 7 days.    Follow up with your primary care provider or gynecologist if your symptoms are not improving.          ED Prescriptions   None    PDMP not reviewed this encounter.   Sharion Balloon, NP 02/12/21 1625

## 2021-02-13 ENCOUNTER — Telehealth (HOSPITAL_COMMUNITY): Payer: Self-pay | Admitting: Emergency Medicine

## 2021-02-13 LAB — CERVICOVAGINAL ANCILLARY ONLY
Bacterial Vaginitis (gardnerella): POSITIVE — AB
Candida Glabrata: NEGATIVE
Candida Vaginitis: NEGATIVE
Chlamydia: NEGATIVE
Comment: NEGATIVE
Comment: NEGATIVE
Comment: NEGATIVE
Comment: NEGATIVE
Comment: NEGATIVE
Comment: NORMAL
Neisseria Gonorrhea: NEGATIVE
Trichomonas: NEGATIVE

## 2021-02-13 LAB — RPR: RPR Ser Ql: NONREACTIVE

## 2021-02-13 LAB — HIV ANTIBODY (ROUTINE TESTING W REFLEX): HIV Screen 4th Generation wRfx: NONREACTIVE

## 2021-02-13 MED ORDER — METRONIDAZOLE 500 MG PO TABS: 500.0000 mg | ORAL_TABLET | Freq: Two times a day (BID) | ORAL | 0 refills | Status: DC

## 2021-02-14 ENCOUNTER — Telehealth (HOSPITAL_COMMUNITY): Payer: Self-pay | Admitting: Emergency Medicine

## 2021-02-14 LAB — HSV CULTURE AND TYPING

## 2021-02-14 MED ORDER — ACYCLOVIR 400 MG PO TABS: 400.0000 mg | ORAL_TABLET | Freq: Three times a day (TID) | ORAL | 0 refills | Status: AC

## 2021-02-17 ENCOUNTER — Telehealth: Payer: Self-pay | Admitting: Emergency Medicine

## 2021-03-23 ENCOUNTER — Telehealth: Payer: Self-pay

## 2021-03-23 ENCOUNTER — Other Ambulatory Visit (HOSPITAL_COMMUNITY): Payer: Self-pay

## 2021-03-23 ENCOUNTER — Encounter: Payer: Self-pay | Admitting: Pharmacist

## 2021-03-23 NOTE — Telephone Encounter (Signed)
[facility] Patient Advocate Encounter ? ?Insurance verification completed.   ? ?The patient is uninsured and will need patient assistance for medication. ? ?We can complete the application and will need to meet with the patient for signatures and income documentation. ? ?Merryl Buckels, CPhT ?Specialty Pharmacy Patient Advocate ?[facility] ?Phone: [phone] ?Fax:  [phone]  ?

## 2021-08-16 ENCOUNTER — Encounter: Payer: Self-pay | Admitting: Emergency Medicine

## 2021-08-16 ENCOUNTER — Ambulatory Visit
Admission: EM | Admit: 2021-08-16 | Discharge: 2021-08-16 | Disposition: A | Discharge status: Home / Self Care | Payer: Self-pay | Attending: Emergency Medicine | Admitting: Emergency Medicine

## 2021-08-16 DIAGNOSIS — N898 Other specified noninflammatory disorders of vagina: Secondary | ICD-10-CM | POA: Insufficient documentation

## 2021-08-16 LAB — POCT URINALYSIS DIP (MANUAL ENTRY)
Bilirubin, UA: NEGATIVE
Blood, UA: NEGATIVE
Glucose, UA: NEGATIVE mg/dL
Ketones, POC UA: NEGATIVE mg/dL
Leukocytes, UA: NEGATIVE
Nitrite, UA: NEGATIVE
Protein Ur, POC: NEGATIVE mg/dL
Spec Grav, UA: >=1.03 — AB (ref 1.010–1.025)
Urobilinogen, UA: 0.2 E.U./dL
pH, UA: 6 (ref 5.0–8.0)

## 2021-08-16 LAB — POCT URINE PREGNANCY: Preg Test, Ur: NEGATIVE

## 2021-08-16 MED ORDER — METRONIDAZOLE 500 MG PO TABS: 500.0000 mg | ORAL_TABLET | Freq: Two times a day (BID) | ORAL | 0 refills | Status: AC

## 2021-08-16 NOTE — ED Triage Notes (Addendum)
Patient c/o vaginal  odor x 1 week.   Patient denies fever, dysuria, ABD pain, pelvic, or N/V/D.   Patient denies vaginal itchiness.   Patient endorses " outer vaginal tingling".   Patient denies changes in vaginal discharge. Patient wants STD Testing.   Patient endorses " urine just smells foul, no particular smell, just not my normal".   Patient hasn't taken any medications for symptoms.

## 2021-08-16 NOTE — Discharge Instructions (Signed)
Today you are being treated prophylactically for  Bacterial vaginosis   Take Metronidazole 500 mg twice a day for 7 days, do not drink alcohol while using medication, this will make you feel sick   Bacterial vaginosis which results from an overgrowth of one on several organisms that are normally present in your vagina. Vaginosis is an inflammation of the vagina that can result in discharge, itching and pain.  Labs pending  you will be contacted if positive for any sti and treatment will be sent to the pharmacy, you will have to return to the clinic if positive for gonorrhea to receive treatment   Please refrain from having sex until labs results, if positive please refrain from having sex until treatment complete and symptoms resolve   If positive for  Chlamydia  gonorrhea or trichomoniasis please notify partner or partners so they may tested as well  Moving forward, it is recommended you use some form of protection against the transmission of sti infections  such as condoms or dental dams with each sexual encounter     In addition: Avoid baths, hot tubs and whirlpool spas.  Don't use scented or harsh soaps Avoid irritants. These include scented tampons and pads. Wipe from front to back after using the toilet. Don't douche. Your vagina doesn't require cleansing other than normal bathing.  Use a condom.  Wear cotton underwear, this fabric absorbs some moisture.      

## 2021-08-16 NOTE — ED Provider Notes (Signed)
Renaldo Fiddler    CSN: [id number] Arrival date & time: 08/16/21  7262      History   Chief Complaint Chief Complaint  Patient presents with   Vaginal Odor     HPI Kristina Reyes is a 26 y.o. female.   Patient presents with vaginal odor for 7 days.  Unsure of presence of discharge as she is currently on menstrual cycle.  Associated mild vaginal irritation.  Denies vaginal itching, urinary symptoms, new rash or lesions, lower abdominal pain or pressure, flank pain, fever or chills.  Sexually active, 1 female partner, always condom use.  Has not attempted treatment of current symptoms.  Endorses that she does use boric acid suppositories regularly.  Past Medical History:  Diagnosis Date   Asthma     Patient Active Problem List   Diagnosis Date Noted   Normal labor and delivery 05/13/2016   Decreased fetal movement determined by examination, third trimester 05/01/2016   Fundal height low for dates in third trimester 04/17/2016   History of marijuana use 11/20/2015   GBS bacteriuria 11/18/2015   Supervision of normal first pregnancy, antepartum 11/16/2015    Past Surgical History:  Procedure Laterality Date   arm surgery Left    BB removed from left forearm.     OB History     Gravida  1   Para  1   Term  1   Preterm  0   AB  0   Living  1      SAB  0   IAB  0   Ectopic  0   Multiple  0   Live Births  1            Home Medications    Prior to Admission medications   Medication Sig Start Date End Date Taking? Authorizing Provider  metroNIDAZOLE (FLAGYL) 500 MG tablet Take 1 tablet (500 mg total) by mouth 2 (two) times daily. 08/16/21   Valinda Hoar, NP    Family History Family History  Problem Relation Age of Onset   Asthma Mother    Asthma Brother     Social History Social History   Tobacco Use   Smoking status: Never   Smokeless tobacco: Never  Vaping Use   Vaping Use: Never used  Substance Use Topics   Alcohol use: No     Alcohol/week: 0.0 standard drinks of alcohol   Drug use: No     Allergies   Patient has no known allergies.   Review of Systems Review of Systems  Constitutional: Negative.   HENT: Negative.    Respiratory: Negative.    Cardiovascular: Negative.   Genitourinary: Negative.      Physical Exam Triage Vital Signs ED Triage Vitals  Enc Vitals Group     BP 08/16/21 0855 123/82     Pulse Rate 08/16/21 0855 71     Resp 08/16/21 0855 16     Temp 08/16/21 0855 98.3 F (36.8 C)     Temp Source 08/16/21 0855 Oral     SpO2 08/16/21 0855 98 %     Weight --      Height --      Head Circumference --      Peak Flow --      Pain Score 08/16/21 0859 0     Pain Loc --      Pain Edu? --      Excl. in GC? --    No data found.  Updated Vital Signs BP 123/82 (BP Location: Left Arm)   Pulse 71   Temp 98.3 F (36.8 C) (Oral)   Resp 16   LMP 08/10/2021 (Exact Date)   SpO2 98%   Breastfeeding No   Visual Acuity Right Eye Distance:   Left Eye Distance:   Bilateral Distance:    Right Eye Near:   Left Eye Near:    Bilateral Near:     Physical Exam Constitutional:      Appearance: Normal appearance.  HENT:     Head: Normocephalic.  Eyes:     Extraocular Movements: Extraocular movements intact.  Pulmonary:     Effort: Pulmonary effort is normal.  Genitourinary:    Comments: deferred Neurological:     Mental Status: She is alert and oriented to person, place, and time. Mental status is at baseline.  Psychiatric:        Mood and Affect: Mood normal.        Behavior: Behavior normal.      UC Treatments / Results  Labs (all labs ordered are listed, but only abnormal results are displayed) Labs Reviewed  POCT URINALYSIS DIP (MANUAL ENTRY) - Abnormal; Notable for the following components:      Result Value   Clarity, UA cloudy (*)    Spec Grav, UA >=1.030 (*)    All other components within normal limits  POCT URINE PREGNANCY  CERVICOVAGINAL ANCILLARY ONLY     EKG   Radiology No results found.  Procedures Procedures (including critical care time)  Medications Ordered in UC Medications - No data to display  Initial Impression / Assessment and Plan / UC Course  I have reviewed the triage vital signs and the nursing notes.  Pertinent labs & imaging results that were available during my care of the patient were reviewed by me and considered in my medical decision making (see chart for details).  Vaginal odor  Urinalysis negative, urine pregnancy negative, presentation is most likely bacterial vaginosis, discussed with patient, STI labs pending, will prophylactically treat, metronidazole 7-day course prescribed, advised abstinence from alcohol while using medication, advised abstinence until lab results, treatment is complete and all symptoms have resolved, may continue use of boric acid suppositories with follow-up with urgent care as needed if symptoms persist or recur Final Clinical Impressions(s) / UC Diagnoses   Final diagnoses:  Vaginal odor     Discharge Instructions      Today you are being treated prophylactically for  Bacterial vaginosis   Take Metronidazole 500 mg twice a day for 7 days, do not drink alcohol while using medication, this will make you feel sick   Bacterial vaginosis which results from an overgrowth of one on several organisms that are normally present in your vagina. Vaginosis is an inflammation of the vagina that can result in discharge, itching and pain.  Labs pendingyou will be contacted if positive for any sti and treatment will be sent to the pharmacy, you will have to return to the clinic if positive for gonorrhea to receive treatment   Please refrain from having sex until labs results, if positive please refrain from having sex until treatment complete and symptoms resolve   If positive for  Chlamydia  gonorrhea or trichomoniasis please notify partner or partners so they may tested as  well  Moving forward, it is recommended you use some form of protection against the transmission of sti infections  such as condoms or dental dams with each sexual encounter  In addition: Avoid baths, hot tubs and whirlpool spas.  Don't use scented or harsh soaps Avoid irritants. These include scented tampons and pads. Wipe from front to back after using the toilet. Don't douche. Your vagina doesn't require cleansing other than normal bathing.  Use a condom.  Wear cotton underwear, this fabric absorbs some moisture.        ED Prescriptions     Medication Sig Dispense Auth. Provider   metroNIDAZOLE (FLAGYL) 500 MG tablet Take 1 tablet (500 mg total) by mouth 2 (two) times daily. 14 tablet Zellie Jenning, Elita Boone, NP      PDMP not reviewed this encounter.   Valinda Hoar, [place] 08/16/21 [phone]

## 2021-08-17 LAB — CERVICOVAGINAL ANCILLARY ONLY
Bacterial Vaginitis (gardnerella): POSITIVE — AB
Candida Glabrata: NEGATIVE
Candida Vaginitis: NEGATIVE
Chlamydia: NEGATIVE
Comment: NEGATIVE
Comment: NEGATIVE
Comment: NEGATIVE
Comment: NEGATIVE
Comment: NEGATIVE
Comment: NORMAL
Neisseria Gonorrhea: NEGATIVE
Trichomonas: NEGATIVE
